# Patient Record
Sex: Female | Born: 1975 | Race: White | Hispanic: No | Smoking: Current every day smoker
Health system: Southern US, Community
[De-identification: ages and names within clinical notes are randomized; demographics above are authoritative.]

## PROBLEM LIST (undated history)

## (undated) DIAGNOSIS — I4891 Unspecified atrial fibrillation: Secondary | ICD-10-CM

## (undated) DIAGNOSIS — E079 Disorder of thyroid, unspecified: Secondary | ICD-10-CM

## (undated) DIAGNOSIS — J449 Chronic obstructive pulmonary disease, unspecified: Secondary | ICD-10-CM

## (undated) DIAGNOSIS — F32A Depression, unspecified: Secondary | ICD-10-CM

## (undated) DIAGNOSIS — I429 Cardiomyopathy, unspecified: Secondary | ICD-10-CM

## (undated) HISTORY — DX: Cardiomyopathy, unspecified: I42.9

## (undated) HISTORY — DX: Depression, unspecified: F32.A

## (undated) HISTORY — PX: DILATION AND CURETTAGE OF UTERUS: SHX78

## (undated) HISTORY — DX: Chronic obstructive pulmonary disease, unspecified: J44.9

## (undated) HISTORY — DX: Unspecified atrial fibrillation: I48.91

## (undated) HISTORY — PX: OTHER SURGICAL HISTORY: SHX169

## (undated) HISTORY — PX: TONSILLECTOMY: SUR1361

---

## 2020-03-05 ENCOUNTER — Other Ambulatory Visit: Payer: Self-pay

## 2020-03-05 ENCOUNTER — Emergency Department (HOSPITAL_COMMUNITY): Payer: Self-pay

## 2020-03-05 ENCOUNTER — Emergency Department (HOSPITAL_COMMUNITY)
Admission: EM | Admit: 2020-03-05 | Discharge: 2020-03-05 | Disposition: A | Discharge status: Home / Self Care | Payer: Self-pay | Attending: Emergency Medicine | Admitting: Emergency Medicine

## 2020-03-05 ENCOUNTER — Encounter (HOSPITAL_COMMUNITY): Payer: Self-pay

## 2020-03-05 DIAGNOSIS — Z5321 Procedure and treatment not carried out due to patient leaving prior to being seen by health care provider: Secondary | ICD-10-CM | POA: Insufficient documentation

## 2020-03-05 DIAGNOSIS — R0602 Shortness of breath: Secondary | ICD-10-CM | POA: Insufficient documentation

## 2020-03-05 HISTORY — DX: Disorder of thyroid, unspecified: E07.9

## 2020-03-05 MED ORDER — ALBUTEROL SULFATE (2.5 MG/3ML) 0.083% IN NEBU: 5.0000 mg | INHALATION_SOLUTION | Freq: Once | RESPIRATORY_TRACT | Status: DC

## 2020-03-05 NOTE — ED Triage Notes (Signed)
SOB that began a couple days ago, increased SOB that started last night. Denies chest pain, lightheadedness, or dizziness. States drinking black coffee helps. No med intervention.

## 2020-03-05 NOTE — ED Notes (Signed)
EKG done and handed to Dr. Zackowski 

## 2020-03-06 ENCOUNTER — Ambulatory Visit
Admission: RE | Admit: 2020-03-06 | Discharge: 2020-03-06 | Disposition: A | Discharge status: Home / Self Care | Payer: Self-pay | Source: Ambulatory Visit | Attending: Internal Medicine | Admitting: Internal Medicine

## 2020-03-06 VITALS — BP 147/92 | HR 68 | Temp 97.7°F | Resp 18

## 2020-03-06 DIAGNOSIS — J441 Chronic obstructive pulmonary disease with (acute) exacerbation: Secondary | ICD-10-CM

## 2020-03-06 MED ORDER — ALBUTEROL SULFATE HFA 108 (90 BASE) MCG/ACT IN AERS: 1.0000 | INHALATION_SPRAY | Freq: Four times a day (QID) | RESPIRATORY_TRACT | 0 refills | Status: DC | PRN

## 2020-03-06 MED ORDER — PREDNISONE 20 MG PO TABS: 40.0000 mg | ORAL_TABLET | Freq: Every day | ORAL | 0 refills | Status: AC

## 2020-03-06 NOTE — ED Triage Notes (Signed)
Pt states she has had cough for past 2 weeks that worsened over past couple days, concerned about asthma

## 2020-03-06 NOTE — Discharge Instructions (Signed)
Smoke cessation is advised If you have worsening symptoms please return to the urgent to be further evaluated.

## 2020-03-07 NOTE — ED Provider Notes (Addendum)
[facility]    CSN: [id number] Arrival date & time: 03/06/20  1507      History   Chief Complaint Cough of 2 weeks duration.  HPI Erin Deleon is a 44 y.o. female with a 30-pack-year history of smoking comes to the urgent care with a 2-week history of cough productive of yellowish sputum, worsening shortness of breath and wheezing.  Patient symptoms started insidiously and has been persistent.  No fever or chills.  Patient continues to smoke cigarettes.  No chest pain or chest pressure.  No sick contacts.  No loss of taste or smell.   HPI  Past Medical History:  Diagnosis Date  . Thyroid disease     There are no problems to display for this patient.   Past Surgical History:  Procedure Laterality Date  . TONSILLECTOMY      OB History   No obstetric history on file.      Home Medications    Prior to Admission medications   Medication Sig Start Date End Date Taking? Authorizing Provider  albuterol (VENTOLIN HFA) 108 (90 Base) MCG/ACT inhaler Inhale 1 puff into the lungs every 6 (six) hours as needed for wheezing or shortness of breath. 03/06/20   Alverto Shedd, Britta Mccreedy, MD  predniSONE (DELTASONE) 20 MG tablet Take 2 tablets (40 mg total) by mouth daily for 5 days. 03/06/20 03/11/20  LampteyBritta Mccreedy, MD    Family History Family History  Family history unknown: Yes    Social History Social History   Tobacco Use  . Smoking status: Current Every Day Smoker    Packs/day: 0.50    Types: Cigarettes  . Smokeless tobacco: Never Used  Substance Use Topics  . Alcohol use: Yes    Comment: occ  . Drug use: Never     Allergies   Patient has no known allergies.   Review of Systems Review of Systems  Constitutional: Negative.   Respiratory: Positive for cough, chest tightness, shortness of breath and wheezing.   Cardiovascular: Negative for chest pain.  Gastrointestinal: Negative.   Musculoskeletal: Negative.   Neurological: Negative for dizziness,  weakness, numbness and headaches.  Psychiatric/Behavioral: Negative for confusion.     Physical Exam Triage Vital Signs ED Triage Vitals  Enc Vitals Group     BP 03/06/20 1552 (!) 147/92     Pulse Rate 03/06/20 1552 68     Resp 03/06/20 1552 18     Temp 03/06/20 1552 97.7 F (36.5 C)     Temp Source 03/06/20 1552 Oral     SpO2 03/06/20 1552 98 %     Weight --      Height --      Head Circumference --      Peak Flow --      Pain Score 03/06/20 1630 0     Pain Loc --      Pain Edu? --      Excl. in GC? --    No data found.  Updated Vital Signs BP (!) 147/92 (BP Location: Right Arm)   Pulse 68   Temp 97.7 F (36.5 C) (Oral)   Resp 18   LMP 02/16/2020   SpO2 98%   Visual Acuity Right Eye Distance:   Left Eye Distance:   Bilateral Distance:    Right Eye Near:   Left Eye Near:    Bilateral Near:     Physical Exam Vitals and nursing note reviewed.  Constitutional:      General: She  is not in acute distress.    Appearance: She is not ill-appearing.  HENT:     Right Ear: Tympanic membrane normal.     Left Ear: Tympanic membrane normal.     Nose: No rhinorrhea.     Mouth/Throat:     Pharynx: No oropharyngeal exudate or posterior oropharyngeal erythema.  Cardiovascular:     Rate and Rhythm: Normal rate and regular rhythm.     Pulses: Normal pulses.     Heart sounds: Normal heart sounds.  Pulmonary:     Effort: Pulmonary effort is normal. No respiratory distress.     Breath sounds: Normal breath sounds. No stridor. No wheezing or rhonchi.  Abdominal:     General: Bowel sounds are normal.     Palpations: Abdomen is soft.  Neurological:     General: No focal deficit present.     Mental Status: She is alert.      UC Treatments / Results  Labs (all labs ordered are listed, but only abnormal results are displayed) Labs Reviewed - No data to display  EKG   Radiology No results found.  Procedures Procedures (including critical care  time)  Medications Ordered in UC Medications - No data to display  Initial Impression / Assessment and Plan / UC Course  I have reviewed the triage vital signs and the nursing notes.  Pertinent labs & imaging results that were available during my care of the patient were reviewed by me and considered in my medical decision making (see chart for details).    1.  Chronic bronchitis with exacerbation: Prednisone 40 mg orally daily for 5 days Albuterol inhaler every 6 hours as needed Smoke cessation counseling was given.  Patient is in the contemplative state of smoke cessation.  Time spent on smoking cessation is 6 minutes Return precautions given Final Clinical Impressions(s) / UC Diagnoses   Final diagnoses:  Obstructive chronic bronchitis with exacerbation [facility])     Discharge Instructions     Smoke cessation is advised If you have worsening symptoms please return to the urgent to be further evaluated.    ED Prescriptions    Medication Sig Dispense Auth. Provider   albuterol (VENTOLIN HFA) 108 (90 Base) MCG/ACT inhaler Inhale 1 puff into the lungs every 6 (six) hours as needed for wheezing or shortness of breath. 18 g Aqueelah Cotrell, Britta Mccreedy, MD   predniSONE (DELTASONE) 20 MG tablet Take 2 tablets (40 mg total) by mouth daily for 5 days. 10 tablet Juaquina Machnik, Britta Mccreedy, MD     PDMP not reviewed this encounter.   Merrilee Jansky, MD 03/07/20 1217    Merrilee Jansky, MD 03/07/20 1218

## 2020-06-19 ENCOUNTER — Ambulatory Visit
Admission: EM | Admit: 2020-06-19 | Discharge: 2020-06-19 | Disposition: A | Discharge status: Home / Self Care | Payer: Self-pay | Attending: Physician Assistant | Admitting: Physician Assistant

## 2020-06-19 ENCOUNTER — Other Ambulatory Visit: Payer: Self-pay

## 2020-06-19 DIAGNOSIS — R300 Dysuria: Secondary | ICD-10-CM

## 2020-06-19 DIAGNOSIS — R609 Edema, unspecified: Secondary | ICD-10-CM

## 2020-06-19 LAB — POCT URINALYSIS DIP (MANUAL ENTRY)
Bilirubin, UA: NEGATIVE
Glucose, UA: NEGATIVE mg/dL
Ketones, POC UA: NEGATIVE mg/dL
Leukocytes, UA: NEGATIVE
Nitrite, UA: NEGATIVE
Protein Ur, POC: 30 mg/dL — AB
Spec Grav, UA: 1.025 (ref 1.010–1.025)
Urobilinogen, UA: 2 E.U./dL — AB
pH, UA: 7 (ref 5.0–8.0)

## 2020-06-19 NOTE — ED Triage Notes (Signed)
Pt presents with c/o difficulty urinating, pt states that she has not been making urine for past week and has taken over the counter diuretics,, had some results ans then states she had pain in vagina las night

## 2020-06-19 NOTE — Discharge Instructions (Addendum)
Go t the Emergency department

## 2020-06-19 NOTE — ED Provider Notes (Addendum)
[facility]    CSN: [id number] Arrival date & time: 06/19/20  1751      History   Chief Complaint Chief Complaint  Patient presents with  . Dysuria    HPI Erin Deleon is a 45 y.o. female.   The history is provided by the patient. No language interpreter was used.  Dysuria Pain quality:  Aching Pain severity:  Moderate Onset quality:  Gradual Timing:  Constant Progression:  Worsening Chronicity:  New Relieved by:  Nothing Worsened by:  Nothing Ineffective treatments:  None tried Associated symptoms: abdominal pain   Associated symptoms: no fever   Pt reports only urinating 3 times in 24 hours.  Pt reports she is short of breath.  Pt reports her legs are swollen.    Past Medical History:  Diagnosis Date  . Thyroid disease     There are no problems to display for this patient.   Past Surgical History:  Procedure Laterality Date  . TONSILLECTOMY      OB History   No obstetric history on file.      Home Medications    Prior to Admission medications   Medication Sig Start Date End Date Taking? Authorizing Provider  albuterol (VENTOLIN HFA) 108 (90 Base) MCG/ACT inhaler Inhale 1 puff into the lungs every 6 (six) hours as needed for wheezing or shortness of breath. 03/06/20   Lamptey, Britta Mccreedy, MD    Family History Family History  Family history unknown: Yes    Social History Social History   Tobacco Use  . Smoking status: Current Every Day Smoker    Packs/day: 0.50    Types: Cigarettes  . Smokeless tobacco: Never Used  Substance Use Topics  . Alcohol use: Yes    Comment: occ  . Drug use: Never     Allergies   Patient has no known allergies.   Review of Systems Review of Systems  Constitutional: Negative for fever.  Gastrointestinal: Positive for abdominal pain.  Genitourinary: Positive for dysuria.  All other systems reviewed and are negative.    Physical Exam Triage Vital Signs ED Triage Vitals  Enc Vitals Group      BP 06/19/20 1808 (!) 158/73     Pulse Rate 06/19/20 1808 (!) 109     Resp 06/19/20 1808 18     Temp 06/19/20 1808 97.9 F (36.6 C)     Temp src --      SpO2 06/19/20 1808 97 %     Weight --      Height --      Head Circumference --      Peak Flow --      Pain Score 06/19/20 1809 5     Pain Loc --      Pain Edu? --      Excl. in GC? --    No data found.  Updated Vital Signs BP (!) 158/73   Pulse (!) 109   Temp 97.9 F (36.6 C)   Resp 18   LMP 04/30/2020   SpO2 97%   Visual Acuity Right Eye Distance:   Left Eye Distance:   Bilateral Distance:    Right Eye Near:   Left Eye Near:    Bilateral Near:     Physical Exam Vitals and nursing note reviewed.  Constitutional:      Appearance: She is well-developed.  HENT:     Head: Normocephalic.  Cardiovascular:     Rate and Rhythm: Normal rate.  Pulmonary:  Effort: Pulmonary effort is normal.  Abdominal:     General: There is no distension.  Musculoskeletal:        General: Swelling and tenderness present. Normal range of motion.     Cervical back: Normal range of motion.     Right lower leg: Edema present.     Left lower leg: Edema present.  Skin:    General: Skin is warm.  Neurological:     General: No focal deficit present.     Mental Status: She is alert and oriented to person, place, and time.  Psychiatric:        Mood and Affect: Mood normal.      UC Treatments / Results  Labs (all labs ordered are listed, but only abnormal results are displayed) Labs Reviewed  POCT URINALYSIS DIP (MANUAL ENTRY) - Abnormal; Notable for the following components:      Result Value   Color, UA straw (*)    Clarity, UA hazy (*)    Blood, UA large (*)    Protein Ur, POC =30 (*)    Urobilinogen, UA 2.0 (*)    All other components within normal limits    EKG   Radiology No results found.  Procedures Procedures (including critical care time)  Medications Ordered in UC Medications - No data to  display  Initial Impression / Assessment and Plan / UC Course  I have reviewed the triage vital signs and the nursing notes.  Pertinent labs & imaging results that were available during my care of the patient were reviewed by me and considered in my medical decision making (see chart for details).     MDM: Pt reports she has very little urine output.  Pt complains of shortness of breath and abdominal pain  Final Clinical Impressions(s) / UC Diagnoses   Final diagnoses:  Fluid retention     Discharge Instructions     Go t the Emergency department    ED Prescriptions    None     PDMP not reviewed this encounter.  An After Visit Summary was printed and given to the patient.    Elson Areas, PA-C 06/19/20 1848    Elson Areas, PA-C 06/19/20 2009

## 2020-07-11 ENCOUNTER — Ambulatory Visit
Admission: EM | Admit: 2020-07-11 | Discharge: 2020-07-11 | Disposition: A | Discharge status: Home / Self Care | Payer: Self-pay | Attending: Internal Medicine | Admitting: Internal Medicine

## 2020-07-11 ENCOUNTER — Encounter: Payer: Self-pay | Admitting: Emergency Medicine

## 2020-07-11 ENCOUNTER — Other Ambulatory Visit: Payer: Self-pay

## 2020-07-11 ENCOUNTER — Ambulatory Visit (INDEPENDENT_AMBULATORY_CARE_PROVIDER_SITE_OTHER): Payer: Self-pay

## 2020-07-11 DIAGNOSIS — M7989 Other specified soft tissue disorders: Secondary | ICD-10-CM

## 2020-07-11 DIAGNOSIS — M898X1 Other specified disorders of bone, shoulder: Secondary | ICD-10-CM

## 2020-07-11 DIAGNOSIS — M25511 Pain in right shoulder: Secondary | ICD-10-CM

## 2020-07-11 MED ORDER — METHYLPREDNISOLONE 4 MG PO TBPK: ORAL_TABLET | ORAL | 0 refills | Status: DC

## 2020-07-11 NOTE — ED Triage Notes (Signed)
Lump at right collar bone area that is tender to the touch x 1 month., no injury that she knows of

## 2020-07-11 NOTE — Discharge Instructions (Addendum)
Your xray shows mild spurring which is changes seen in arthritis. Follow up with a general practitioner or orthopedic Dr if symptoms persist after done with medication

## 2020-07-11 NOTE — ED Provider Notes (Signed)
[facility]    CSN: [id number] Arrival date & time: 07/11/20  1315      History   Chief Complaint No chief complaint on file.   HPI Erin Deleon is a 45 y.o. female who presents with a lump on her R clavicle x 1 month. It is tender to touch. She denies injuring herself on this area or ever. She is R hand dominant  And does a lot of repetitive lifting at work, and has not had time to seek medical care, but today she could not tolerate the pain. Has hx of being diagnosed with RA at age 68 years old. Has been taking Aleeve but has not helped    Past Medical History:  Diagnosis Date  . Thyroid disease     There are no problems to display for this patient.   Past Surgical History:  Procedure Laterality Date  . TONSILLECTOMY      OB History   No obstetric history on file.      Home Medications    Prior to Admission medications   Medication Sig Start Date End Date Taking? Authorizing Provider  methylPREDNISolone (MEDROL DOSEPAK) 4 MG TBPK tablet Take as directed 07/11/20  Yes Rodriguez-Southworth, Nettie Elm, PA-C  albuterol (VENTOLIN HFA) 108 (90 Base) MCG/ACT inhaler Inhale 1 puff into the lungs every 6 (six) hours as needed for wheezing or shortness of breath. 03/06/20   Lamptey, Britta Mccreedy, MD    Family History Family History  Family history unknown: Yes    Social History Social History   Tobacco Use  . Smoking status: Current Every Day Smoker    Packs/day: 0.50    Types: Cigarettes  . Smokeless tobacco: Never Used  Substance Use Topics  . Alcohol use: Yes    Comment: occ  . Drug use: Never     Allergies   Patient has no known allergies.   Review of Systems Review of Systems  Constitutional: Negative for fever.  Musculoskeletal: Positive for arthralgias. Negative for neck pain and neck stiffness.  Neurological: Negative for numbness.  Hematological: Negative for adenopathy.     Physical Exam Triage Vital Signs ED Triage Vitals  Enc  Vitals Group     BP 07/11/20 1324 117/82     Pulse Rate 07/11/20 1324 94     Resp 07/11/20 1324 18     Temp 07/11/20 1324 97.9 F (36.6 C)     Temp Source 07/11/20 1324 Oral     SpO2 07/11/20 1324 95 %     Weight --      Height --      Head Circumference --      Peak Flow --      Pain Score 07/11/20 1325 10     Pain Loc --      Pain Edu? --      Excl. in GC? --    No data found.  Updated Vital Signs BP 117/82 (BP Location: Right Arm)   Pulse 94   Temp 97.9 F (36.6 C) (Oral)   Resp 18   LMP 06/19/2020   SpO2 95%   Visual Acuity Right Eye Distance:   Left Eye Distance:   Bilateral Distance:    Right Eye Near:   Left Eye Near:    Bilateral Near:     Physical Exam Constitutional:      Appearance: Normal appearance. She is obese.  HENT:     Head: Normocephalic.     Right Ear: External ear normal.  Left Ear: External ear normal.  Eyes:     Conjunctiva/sclera: Conjunctivae normal.  Neck:     Comments: No supraclavicular adenopathy noted  Pulmonary:     Effort: Pulmonary effort is normal.  Musculoskeletal:     Cervical back: Neck supple.     Comments: R clavicle with firm swelling and severe tenderness to palpation on the [facility] joint region. There is no fluctuation or redness R SHOULDER- decreased ROM due to pain.   Lymphadenopathy:     Cervical: No cervical adenopathy.     Right cervical: No superficial cervical adenopathy.    Left cervical: No superficial cervical adenopathy.  Skin:    General: Skin is warm and dry.  Neurological:     Mental Status: She is alert and oriented to person, place, and time.     Gait: Gait normal.  Psychiatric:        Mood and Affect: Mood normal.        Behavior: Behavior normal.        Thought Content: Thought content normal.        Judgment: Judgment normal.    UC Treatments / Results  Labs (all labs ordered are listed, but only abnormal results are displayed) Labs Reviewed - No data to  display  EKG   Radiology DG Clavicle Right  Result Date: 07/11/2020 CLINICAL DATA:  Pain and swelling along the distal clavicle EXAM: RIGHT CLAVICLE - 2+ VIEWS COMPARISON:  None FINDINGS: Mild spurring at the [facility] joint. No acute fracture. No overt erosion or bony demineralization in this vicinity. Flaring of the contour of the mid to distal clavicle is present and is often a normal finding although could also be related to a remote fully healed fracture. IMPRESSION: 1. Aside from mild spurring of the [facility] joint, no significant abnormality is observed. If the patient has fluctuance, fever, or signs of infection, or if otherwise clinically warranted, cross-sectional imaging such as MRI could be utilized for further characterization. Electronically Signed   By: Gaylyn Rong M.D.   On: 07/11/2020 14:01    Procedures Procedures (including critical care time)  Medications Ordered in UC Medications - No data to display  Initial Impression / Assessment and Plan / UC Course  I have reviewed the triage vital signs and the nursing notes. Pertinent  imaging results that were available during my care of the patient were reviewed by me and considered in my medical decision making (see chart for details). I placed her on Medrol dose pack. See instructions  Final Clinical Impressions(s) / UC Diagnoses   Final diagnoses:  Pain of right clavicle     Discharge Instructions     Your xray shows mild spurring which is changes seen in arthritis. Follow up with a general practitioner or orthopedic Dr if symptoms persist after done with medication    ED Prescriptions    Medication Sig Dispense Auth. Provider   methylPREDNISolone (MEDROL DOSEPAK) 4 MG TBPK tablet Take as directed 21 tablet Rodriguez-Southworth, Nettie Elm, PA-C     PDMP not reviewed this encounter.   Garey Ham, PA-C 07/11/20 1456

## 2020-08-15 ENCOUNTER — Emergency Department (HOSPITAL_COMMUNITY): Payer: Self-pay

## 2020-08-15 ENCOUNTER — Encounter (HOSPITAL_COMMUNITY): Payer: Self-pay

## 2020-08-15 ENCOUNTER — Other Ambulatory Visit: Payer: Self-pay

## 2020-08-15 ENCOUNTER — Emergency Department (HOSPITAL_COMMUNITY)
Admission: EM | Admit: 2020-08-15 | Discharge: 2020-08-15 | Disposition: A | Discharge status: Home / Self Care | Payer: Self-pay | Attending: Emergency Medicine | Admitting: Emergency Medicine

## 2020-08-15 DIAGNOSIS — R002 Palpitations: Secondary | ICD-10-CM | POA: Insufficient documentation

## 2020-08-15 DIAGNOSIS — R000 Tachycardia, unspecified: Secondary | ICD-10-CM | POA: Insufficient documentation

## 2020-08-15 DIAGNOSIS — F17210 Nicotine dependence, cigarettes, uncomplicated: Secondary | ICD-10-CM | POA: Insufficient documentation

## 2020-08-15 LAB — BASIC METABOLIC PANEL
Anion gap: 7 (ref 5–15)
BUN: 10 mg/dL (ref 6–20)
CO2: 23 mmol/L (ref 22–32)
Calcium: 8.8 mg/dL — ABNORMAL LOW (ref 8.9–10.3)
Chloride: 106 mmol/L (ref 98–111)
Creatinine, Ser: 0.36 mg/dL — ABNORMAL LOW (ref 0.44–1.00)
GFR, Estimated: >60 mL/min (ref >60)
Glucose, Bld: 114 mg/dL — ABNORMAL HIGH (ref 70–99)
Potassium: 3.8 mmol/L (ref 3.5–5.1)
Sodium: 136 mmol/L (ref 135–145)

## 2020-08-15 LAB — CBC
HCT: 37.4 % (ref 36.0–46.0)
Hemoglobin: 11.9 g/dL — ABNORMAL LOW (ref 12.0–15.0)
MCH: 27.1 pg (ref 26.0–34.0)
MCHC: 31.8 g/dL (ref 30.0–36.0)
MCV: 85.2 fL (ref 80.0–100.0)
Platelets: 188 10*3/uL (ref 150–400)
RBC: 4.39 MIL/uL (ref 3.87–5.11)
RDW: 13.3 % (ref 11.5–15.5)
WBC: 5.1 10*3/uL (ref 4.0–10.5)
nRBC: 0 % (ref 0.0–0.2)

## 2020-08-15 LAB — TSH: TSH: <0.01 u[IU]/mL — ABNORMAL LOW (ref 0.350–4.500)

## 2020-08-15 NOTE — ED Notes (Addendum)
Lab informed this RN that pt is refusing to have labs drawn. Stated that pt said she wanted to leave. PA notified.

## 2020-08-15 NOTE — ED Provider Notes (Signed)
[facility] EMERGENCY DEPARTMENT Provider Note   CSN: [id number] Arrival date & time: 08/15/20  1241     History Chief Complaint  Patient presents with  . Tachycardia    Erin Deleon is a 45 y.o. female.  HPI      Erin Deleon is a 44 y.o. female with history of thyroid disease who presents to the Emergency Department complaining of palpitations and feeling of her heart racing.  Symptoms have been present for several days, but worse today which prompted her ER visit.  States that she has episodes in which she feels her heartbeat "really fast then skips."  She was treated previously with thyroid medication, but has been off of her medicines for several years as she has not had any recent issues with her thyroid.  She denies chest pain, shortness of breath, fever, chills, and cough.  No numbness or tingling of her face or extremities.  No syncope.  She does admit to recent increase stressors.    Past Medical History:  Diagnosis Date  . Thyroid disease     There are no problems to display for this patient.   Past Surgical History:  Procedure Laterality Date  . DILATION AND CURETTAGE OF UTERUS    . TONSILLECTOMY       OB History   No obstetric history on file.     Family History  Family history unknown: Yes    Social History   Tobacco Use  . Smoking status: Current Every Day Smoker    Packs/day: 0.50    Types: Cigarettes  . Smokeless tobacco: Never Used  Vaping Use  . Vaping Use: Never used  Substance Use Topics  . Alcohol use: Yes    Comment: occ  . Drug use: Never    Home Medications Prior to Admission medications   Medication Sig Start Date End Date Taking? Authorizing Provider  albuterol (VENTOLIN HFA) 108 (90 Base) MCG/ACT inhaler Inhale 1 puff into the lungs every 6 (six) hours as needed for wheezing or shortness of breath. 03/06/20   Merrilee Jansky, MD  methylPREDNISolone (MEDROL DOSEPAK) 4 MG TBPK tablet Take as directed 07/11/20    Rodriguez-Southworth, Nettie Elm, PA-C    Allergies    Patient has no known allergies.  Review of Systems   Review of Systems  Constitutional: Negative for activity change, chills, fatigue and fever.  HENT: Negative for sore throat and trouble swallowing.   Respiratory: Negative for cough and shortness of breath.   Cardiovascular: Positive for palpitations. Negative for chest pain.  Gastrointestinal: Negative for abdominal pain, diarrhea, nausea and vomiting.  Genitourinary: Negative for dysuria and flank pain.  Musculoskeletal: Negative for arthralgias, back pain, myalgias, neck pain and neck stiffness.  Skin: Negative for rash.  Neurological: Negative for dizziness, weakness and numbness.  Hematological: Does not bruise/bleed easily.    Physical Exam Updated Vital Signs BP (!) 114/56   Pulse 91   Temp 98.4 F (36.9 C) (Oral)   Resp (!) 22   Ht 5\' 8"  (1.727 m)   Wt 131.5 kg   LMP 08/14/2020   SpO2 100%   BMI 44.09 kg/m   Physical Exam Vitals and nursing note reviewed.  Constitutional:      General: She is not in acute distress.    Appearance: Normal appearance.  HENT:     Mouth/Throat:     Mouth: Mucous membranes are moist.  Eyes:     Pupils: Pupils are equal, round, and reactive to light.  Neck:  Thyroid: No thyromegaly.     Meningeal: Kernig's sign absent.  Cardiovascular:     Rate and Rhythm: Normal rate and regular rhythm.     Pulses: Normal pulses.     Heart sounds: Normal heart sounds.  Pulmonary:     Effort: Pulmonary effort is normal.     Breath sounds: Normal breath sounds. No wheezing.  Chest:     Chest wall: No tenderness.  Abdominal:     Palpations: Abdomen is soft.     Tenderness: There is no abdominal tenderness. There is no guarding or rebound.  Musculoskeletal:        General: Normal range of motion.     Cervical back: Normal range of motion and neck supple.     Right lower leg: No edema.     Left lower leg: No edema.  Skin:    General:  Skin is warm.     Capillary Refill: Capillary refill takes less than 2 seconds.     Findings: No rash.  Neurological:     Mental Status: She is alert and oriented to person, place, and time.     Sensory: No sensory deficit.     Motor: No weakness.     ED Results / Procedures / Treatments   Labs (all labs ordered are listed, but only abnormal results are displayed) Labs Reviewed  BASIC METABOLIC PANEL - Abnormal; Notable for the following components:      Result Value   Glucose, Bld 114 (*)    Creatinine, Ser 0.36 (*)    Calcium 8.8 (*)    All other components within normal limits  CBC - Abnormal; Notable for the following components:   Hemoglobin 11.9 (*)    All other components within normal limits  TSH  POC URINE PREG, ED    EKG EKG Interpretation  Date/Time:  Wednesday Aug 15 2020 13:05:11 EDT Ventricular Rate:  99 PR Interval:  182 QRS Duration: 84 QT Interval:  352 QTC Calculation: 451 R Axis:   92 Text Interpretation: Normal sinus rhythm Rightward axis Borderline ECG since last tracing no significant change Confirmed by Mancel Bale [phone]) on 08/15/2020 7:23:41 PM   Radiology DG Chest 2 View  Result Date: 08/15/2020 CLINICAL DATA:  Rapid heart rate. EXAM: CHEST - 2 VIEW COMPARISON:  None. FINDINGS: Mild enlargement of the cardiac silhouette. No consolidation or edema. No visible pleural effusions or pneumothorax. Multilevel degenerative change of the thoracic spine. Mild height loss of a midthoracic vertebral body. IMPRESSION: 1. No evidence of acute cardiopulmonary disease. 2. Mild cardiomegaly. 3. Mild height loss of a midthoracic vertebral body, which may be remote or degenerative but is age indeterminate in the absence of priors. Recommend correlation with the presence or absence of point tenderness or a history of recent trauma. Electronically Signed   By: Feliberto Harts MD   On: 08/15/2020 13:43    Procedures Procedures   Medications Ordered in  ED Medications - No data to display  ED Course  I have reviewed the triage vital signs and the nursing notes.  Pertinent labs & imaging results that were available during my care of the patient were reviewed by me and considered in my medical decision making (see chart for details).    MDM Rules/Calculators/A&P                          Patient here for evaluation of tachycardia and palpitations.  Has history of thyroid issues  and has not been on her medication for some time.  Also endorses recent increased stressors.  Initially vitals were without tachycardia or tachypnea.  Repeat vital signs showed tachypnea.  No significant tachycardia or hypoxia.  Work-up here without source to explain her feelings of tachycardia.  Heart rate has not been above 100 during ER stay.  No hypoxia.   Chest x-ray without acute findings.  D-dimer ordered but patient refused additional blood draw.  PERC neg.  I thoroughly explained the reason for the order to further evaluate for possible PE, including risk involved of undiagnosed PE.  Patient verbalized understanding and continues to decline further phlebotomy.  TSH pending.  Patient states she is ready for discharge home.  Will review TSH on MyChart.  I have discussed importance of close PCP follow-up, patient agreeable to plan.  F/u info given. Strict return precautions were also discussed.   Final Clinical Impression(s) / ED Diagnoses Final diagnoses:  Palpitations    Rx / DC Orders ED Discharge Orders    None       Pauline Aus, PA-C 08/17/20 2210    Mancel Bale, MD 08/18/20 [phone]

## 2020-08-15 NOTE — ED Triage Notes (Signed)
Pt to er, pt states that she has a hx of thyroid problems, states that she is here for a rapid heart rate and palpitation for the past few days, states that she is here today because the palpitation are more constant now.  Pt talking in full sentences, resps even and unlabored.

## 2020-08-15 NOTE — Discharge Instructions (Signed)
You may review your TSH level on the MyChart app.  Your results should be available by tomorrow.  I have listed a local primary care provider for you to establish primary care and I have also listed follow-up information for local cardiology.  Return to the emergency department for any new or worsening symptoms.

## 2020-08-15 NOTE — ED Provider Notes (Signed)
Emergency Medicine Provider Triage Evaluation Note  Erin Deleon , a 44 y.o. female  was evaluated in triage.  Pt complains of increased stress, rapid heart rate and palpitations.  Symptoms have been present for several days, worse today.  Describes feeling episodes that her heart is beating fast and has occasional pause.  Has history of hyperthyroidism was previously treated with medication but states she has been off of her medications for several years and she has not had issues with her thyroid for a while.  She denies recent illness, fever, chills, chest pain or shortness of breath.  Review of Systems  Positive: Rapid heart rate and palpitations Negative: Fever, chills, chest pains, and shortness of breath  Physical Exam  BP (!) 152/85   Pulse 95   Temp 98.4 F (36.9 C) (Oral)   Resp 18   Ht 5\' 8"  (1.727 m)   Wt 131.5 kg   LMP 08/14/2020   SpO2 98%   BMI 44.09 kg/m  Gen:   Awake, no distress   Resp:  Normal effort, lungs clear to auscultation bilaterally MSK:   Moves extremities without difficulty  Other:  Appears anxious, no peripheral edema.  Medical Decision Making  Medically screening exam initiated at 3:32 PM.  Appropriate orders placed.  Danny Zimny was informed that the remainder of the evaluation will be completed by another provider, this initial triage assessment does not replace that evaluation, and the importance of remaining in the ED until their evaluation is complete.  Patient reports feelings of tachycardia and palpitations for several days, worse today.  History of thyroid problems, has not been on thyroid medication for years.  No history of cardiac disease.  Patient will need further evaluation.  Agreeable to plan.   Baruch Merl, PA-C 08/15/20 1537    08/17/20, MD 08/17/20 [phone]

## 2020-08-15 NOTE — ED Notes (Signed)
ED Provider at bedside. 

## 2020-09-12 ENCOUNTER — Other Ambulatory Visit: Payer: Self-pay

## 2020-09-12 ENCOUNTER — Ambulatory Visit
Admission: EM | Admit: 2020-09-12 | Discharge: 2020-09-12 | Disposition: A | Discharge status: Home / Self Care | Payer: Self-pay | Attending: Family Medicine | Admitting: Family Medicine

## 2020-09-12 DIAGNOSIS — R197 Diarrhea, unspecified: Secondary | ICD-10-CM

## 2020-09-12 DIAGNOSIS — E0590 Thyrotoxicosis, unspecified without thyrotoxic crisis or storm: Secondary | ICD-10-CM

## 2020-09-12 NOTE — ED Provider Notes (Signed)
  [facility] CARE CENTER   [id number] 09/12/20 Arrival Time: 1233  ASSESSMENT & PLAN:  1. Diarrhea, unspecified type   2. Hyperthyroidism    Symptoms very well could be from uncontrolled hyperthyroidism. Has appt with new PCP in near future. VSS today. Work note provided. Agrees to f/u here or ED should her symptoms worsen.   Follow-up Information     [facility].   Specialty: Urgent Care Why: If worsening or failing to improve as anticipated. Contact information: [address] [place] [address] [phone]                Reviewed expectations re: course of current medical issues. Questions answered. Outlined signs and symptoms indicating need for more acute intervention. Understanding verbalized. After Visit Summary given.   SUBJECTIVE: History from: patient. Erin Deleon is a 45 y.o. female who reports diarrhea. H/O similar with hyperthyroidism. On/off. Needs a note for missing work. Tolerating PO intake without n/v/d.   OBJECTIVE:  Vitals:   09/12/20 1302  BP: 117/79  Pulse: 92  Resp: 18  Temp: 97.8 F (36.6 C)  SpO2: 95%    General appearance: alert; no distress Eyes: PERRLA; EOMI; conjunctiva normal HENT: [place]; AT Neck: supple  Lungs: speaks full sentences without difficulty; unlabored Extremities: no edema Abd: benign Skin: warm and dry Neurologic: normal gait Psychological: alert and cooperative; normal mood and affect  No Known Allergies  Past Medical History:  Diagnosis Date   Thyroid disease    Social History   Socioeconomic History   Marital status: Significant Other    Spouse name: Not on file   Number of children: Not on file   Years of education: Not on file   Highest education level: Not on file  Occupational History   Not on file  Tobacco Use   Smoking status: Every Day    Packs/day: 0.50    Pack years: 0.00    Types: Cigarettes   Smokeless tobacco: Never   Vaping Use   Vaping Use: Never used  Substance and Sexual Activity   Alcohol use: Yes    Comment: occ   Drug use: Never   Sexual activity: Not on file  Other Topics Concern   Not on file  Social History Narrative   Not on file   Social Determinants of Health   Financial Resource Strain: Not on file  Food Insecurity: Not on file  Transportation Needs: Not on file  Physical Activity: Not on file  Stress: Not on file  Social Connections: Not on file  Intimate Partner Violence: Not on file   Family History  Family history unknown: Yes   Past Surgical History:  Procedure Laterality Date   DILATION AND CURETTAGE OF UTERUS     TONSILLECTOMY       Mardella Layman, MD 09/15/20 [phone]

## 2020-09-12 NOTE — ED Triage Notes (Signed)
Pt presents with c/o diarrhea  for past 3 days that she states is thyroid related , needs work note

## 2020-09-25 ENCOUNTER — Ambulatory Visit: Payer: Self-pay | Admitting: Internal Medicine

## 2020-10-08 ENCOUNTER — Ambulatory Visit: Payer: Self-pay | Admitting: Internal Medicine

## 2020-10-23 ENCOUNTER — Emergency Department (HOSPITAL_COMMUNITY): Payer: Medicaid Other

## 2020-10-23 ENCOUNTER — Encounter (HOSPITAL_COMMUNITY): Payer: Self-pay | Admitting: Emergency Medicine

## 2020-10-23 ENCOUNTER — Other Ambulatory Visit: Payer: Self-pay

## 2020-10-23 ENCOUNTER — Encounter: Payer: Self-pay | Admitting: Nurse Practitioner

## 2020-10-23 ENCOUNTER — Inpatient Hospital Stay (HOSPITAL_COMMUNITY)
Admission: EM | Admit: 2020-10-23 | Discharge: 2020-10-29 | DRG: 643 | Disposition: A | Discharge status: Home / Self Care | Payer: Medicaid Other | Source: Ambulatory Visit | Attending: Internal Medicine | Admitting: Internal Medicine

## 2020-10-23 ENCOUNTER — Ambulatory Visit (INDEPENDENT_AMBULATORY_CARE_PROVIDER_SITE_OTHER): Payer: Medicaid Other | Admitting: Nurse Practitioner

## 2020-10-23 VITALS — BP 115/78 | HR 94 | Temp 97.6°F | Ht 67.0 in | Wt 281.0 lb

## 2020-10-23 DIAGNOSIS — Z9114 Patient's other noncompliance with medication regimen: Secondary | ICD-10-CM

## 2020-10-23 DIAGNOSIS — Z79899 Other long term (current) drug therapy: Secondary | ICD-10-CM | POA: Diagnosis not present

## 2020-10-23 DIAGNOSIS — I5021 Acute systolic (congestive) heart failure: Secondary | ICD-10-CM | POA: Diagnosis present

## 2020-10-23 DIAGNOSIS — I4891 Unspecified atrial fibrillation: Secondary | ICD-10-CM | POA: Insufficient documentation

## 2020-10-23 DIAGNOSIS — R7989 Other specified abnormal findings of blood chemistry: Secondary | ICD-10-CM | POA: Diagnosis present

## 2020-10-23 DIAGNOSIS — E0590 Thyrotoxicosis, unspecified without thyrotoxic crisis or storm: Secondary | ICD-10-CM | POA: Diagnosis present

## 2020-10-23 DIAGNOSIS — Z7689 Persons encountering health services in other specified circumstances: Secondary | ICD-10-CM | POA: Diagnosis not present

## 2020-10-23 DIAGNOSIS — F32A Depression, unspecified: Secondary | ICD-10-CM | POA: Diagnosis present

## 2020-10-23 DIAGNOSIS — R5381 Other malaise: Secondary | ICD-10-CM | POA: Diagnosis present

## 2020-10-23 DIAGNOSIS — J449 Chronic obstructive pulmonary disease, unspecified: Secondary | ICD-10-CM | POA: Diagnosis present

## 2020-10-23 DIAGNOSIS — I509 Heart failure, unspecified: Secondary | ICD-10-CM | POA: Diagnosis not present

## 2020-10-23 DIAGNOSIS — E039 Hypothyroidism, unspecified: Secondary | ICD-10-CM | POA: Diagnosis present

## 2020-10-23 DIAGNOSIS — E0500 Thyrotoxicosis with diffuse goiter without thyrotoxic crisis or storm: Principal | ICD-10-CM | POA: Diagnosis present

## 2020-10-23 DIAGNOSIS — R000 Tachycardia, unspecified: Secondary | ICD-10-CM | POA: Diagnosis not present

## 2020-10-23 DIAGNOSIS — E876 Hypokalemia: Secondary | ICD-10-CM | POA: Diagnosis not present

## 2020-10-23 DIAGNOSIS — Z6841 Body Mass Index (BMI) 40.0 and over, adult: Secondary | ICD-10-CM

## 2020-10-23 DIAGNOSIS — E0591 Thyrotoxicosis, unspecified with thyrotoxic crisis or storm: Secondary | ICD-10-CM

## 2020-10-23 DIAGNOSIS — M7989 Other specified soft tissue disorders: Secondary | ICD-10-CM

## 2020-10-23 DIAGNOSIS — R0989 Other specified symptoms and signs involving the circulatory and respiratory systems: Secondary | ICD-10-CM | POA: Diagnosis present

## 2020-10-23 DIAGNOSIS — Z20822 Contact with and (suspected) exposure to covid-19: Secondary | ICD-10-CM | POA: Diagnosis present

## 2020-10-23 DIAGNOSIS — R0602 Shortness of breath: Secondary | ICD-10-CM | POA: Diagnosis not present

## 2020-10-23 DIAGNOSIS — Z139 Encounter for screening, unspecified: Secondary | ICD-10-CM

## 2020-10-23 DIAGNOSIS — I4819 Other persistent atrial fibrillation: Secondary | ICD-10-CM | POA: Diagnosis not present

## 2020-10-23 DIAGNOSIS — R0600 Dyspnea, unspecified: Secondary | ICD-10-CM | POA: Insufficient documentation

## 2020-10-23 DIAGNOSIS — Z0001 Encounter for general adult medical examination with abnormal findings: Secondary | ICD-10-CM | POA: Insufficient documentation

## 2020-10-23 HISTORY — DX: Thyrotoxicosis, unspecified without thyrotoxic crisis or storm: E05.90

## 2020-10-23 LAB — COMPREHENSIVE METABOLIC PANEL
ALT: 20 U/L (ref 0–44)
AST: 24 U/L (ref 15–41)
Albumin: 3.4 g/dL — ABNORMAL LOW (ref 3.5–5.0)
Alkaline Phosphatase: 136 U/L — ABNORMAL HIGH (ref 38–126)
Anion gap: 10 (ref 5–15)
BUN: 10 mg/dL (ref 6–20)
CO2: 20 mmol/L — ABNORMAL LOW (ref 22–32)
Calcium: 8.9 mg/dL (ref 8.9–10.3)
Chloride: 105 mmol/L (ref 98–111)
Creatinine, Ser: 0.4 mg/dL — ABNORMAL LOW (ref 0.44–1.00)
GFR, Estimated: >60 mL/min (ref >60)
Glucose, Bld: 108 mg/dL — ABNORMAL HIGH (ref 70–99)
Potassium: 3.7 mmol/L (ref 3.5–5.1)
Sodium: 135 mmol/L (ref 135–145)
Total Bilirubin: 2.7 mg/dL — ABNORMAL HIGH (ref 0.3–1.2)
Total Protein: 6.8 g/dL (ref 6.5–8.1)

## 2020-10-23 LAB — PROTIME-INR
INR: 1.3 — ABNORMAL HIGH (ref 0.8–1.2)
Prothrombin Time: 16.3 seconds — ABNORMAL HIGH (ref 11.4–15.2)

## 2020-10-23 LAB — CBC WITH DIFFERENTIAL/PLATELET
Abs Immature Granulocytes: 0.04 10*3/uL (ref 0.00–0.07)
Basophils Absolute: 0 10*3/uL (ref 0.0–0.1)
Basophils Relative: 0 %
Eosinophils Absolute: 0.3 10*3/uL (ref 0.0–0.5)
Eosinophils Relative: 2 %
HCT: 37.6 % (ref 36.0–46.0)
Hemoglobin: 12.1 g/dL (ref 12.0–15.0)
Immature Granulocytes: 0 %
Lymphocytes Relative: 36 %
Lymphs Abs: 4.1 10*3/uL — ABNORMAL HIGH (ref 0.7–4.0)
MCH: 26.3 pg (ref 26.0–34.0)
MCHC: 32.2 g/dL (ref 30.0–36.0)
MCV: 81.7 fL (ref 80.0–100.0)
Monocytes Absolute: 1.2 10*3/uL — ABNORMAL HIGH (ref 0.1–1.0)
Monocytes Relative: 11 %
Neutro Abs: 5.9 10*3/uL (ref 1.7–7.7)
Neutrophils Relative %: 51 %
Platelets: 200 10*3/uL (ref 150–400)
RBC: 4.6 MIL/uL (ref 3.87–5.11)
RDW: 15.9 % — ABNORMAL HIGH (ref 11.5–15.5)
WBC: 11.5 10*3/uL — ABNORMAL HIGH (ref 4.0–10.5)
nRBC: 0 % (ref 0.0–0.2)

## 2020-10-23 LAB — RESP PANEL BY RT-PCR (FLU A&B, COVID) ARPGX2
Influenza A by PCR: NEGATIVE
Influenza B by PCR: NEGATIVE
SARS Coronavirus 2 by RT PCR: NEGATIVE

## 2020-10-23 LAB — APTT: aPTT: 34 seconds (ref 24–36)

## 2020-10-23 LAB — TSH: TSH: <0.01 u[IU]/mL — ABNORMAL LOW (ref 0.350–4.500)

## 2020-10-23 LAB — BRAIN NATRIURETIC PEPTIDE: B Natriuretic Peptide: 632 pg/mL — ABNORMAL HIGH (ref 0.0–100.0)

## 2020-10-23 LAB — CBG MONITORING, ED: Glucose-Capillary: 94 mg/dL (ref 70–99)

## 2020-10-23 LAB — T4, FREE: Free T4: >5.5 ng/dL — ABNORMAL HIGH (ref 0.61–1.12)

## 2020-10-23 MED ORDER — DIAZEPAM 5 MG/ML IJ SOLN
5.0000 mg | Freq: Once | INTRAMUSCULAR | Status: AC
  Administered 2020-10-23: 5 mg via INTRAVENOUS
  Filled 2020-10-23: qty 2

## 2020-10-23 MED ORDER — METOPROLOL TARTRATE 5 MG/5ML IV SOLN
5.0000 mg | Freq: Once | INTRAVENOUS | Status: AC
  Administered 2020-10-23: 5 mg via INTRAVENOUS
  Filled 2020-10-23: qty 5

## 2020-10-23 MED ORDER — ACETAMINOPHEN 325 MG PO TABS: 650.0000 mg | ORAL_TABLET | Freq: Four times a day (QID) | ORAL | Status: DC | PRN

## 2020-10-23 MED ORDER — ONDANSETRON HCL 4 MG/2ML IJ SOLN
4.0000 mg | Freq: Four times a day (QID) | INTRAMUSCULAR | Status: DC | PRN
  Administered 2020-10-23: 4 mg via INTRAVENOUS
  Filled 2020-10-23: qty 2

## 2020-10-23 MED ORDER — FUROSEMIDE 10 MG/ML IJ SOLN
20.0000 mg | Freq: Once | INTRAMUSCULAR | Status: AC
  Administered 2020-10-23: 20 mg via INTRAVENOUS
  Filled 2020-10-23: qty 2

## 2020-10-23 MED ORDER — ALBUTEROL SULFATE (2.5 MG/3ML) 0.083% IN NEBU
2.5000 mg | INHALATION_SOLUTION | RESPIRATORY_TRACT | Status: DC | PRN
  Administered 2020-10-25: 2.5 mg via RESPIRATORY_TRACT
  Filled 2020-10-23: qty 3

## 2020-10-23 MED ORDER — PROPRANOLOL HCL 1 MG/ML IV SOLN: 2.0000 mg | Freq: Once | INTRAVENOUS | Status: DC

## 2020-10-23 MED ORDER — OXYCODONE HCL 5 MG PO TABS: 5.0000 mg | ORAL_TABLET | ORAL | Status: DC | PRN

## 2020-10-23 MED ORDER — PROPRANOLOL HCL 10 MG PO TABS
10.0000 mg | ORAL_TABLET | Freq: Once | ORAL | Status: AC
  Administered 2020-10-23: 10 mg via ORAL
  Filled 2020-10-23: qty 1

## 2020-10-23 MED ORDER — HEPARIN SODIUM (PORCINE) 5000 UNIT/ML IJ SOLN
5000.0000 [IU] | Freq: Three times a day (TID) | INTRAMUSCULAR | Status: DC
  Administered 2020-10-23 – 2020-10-25 (×5): 5000 [IU] via SUBCUTANEOUS
  Filled 2020-10-23 (×5): qty 1

## 2020-10-23 MED ORDER — METHOCARBAMOL 1000 MG/10ML IJ SOLN
500.0000 mg | Freq: Four times a day (QID) | INTRAVENOUS | Status: DC | PRN
  Filled 2020-10-23: qty 5

## 2020-10-23 MED ORDER — DEXAMETHASONE SODIUM PHOSPHATE 4 MG/ML IJ SOLN
4.0000 mg | Freq: Once | INTRAMUSCULAR | Status: AC
  Administered 2020-10-23: 4 mg via INTRAVENOUS
  Filled 2020-10-23: qty 1

## 2020-10-23 MED ORDER — ONDANSETRON HCL 4 MG PO TABS: 4.0000 mg | ORAL_TABLET | Freq: Four times a day (QID) | ORAL | Status: DC | PRN

## 2020-10-23 MED ORDER — METOPROLOL TARTRATE 5 MG/5ML IV SOLN: 5.0000 mg | Freq: Four times a day (QID) | INTRAVENOUS | Status: DC | PRN

## 2020-10-23 MED ORDER — ATENOLOL 25 MG PO TABS
50.0000 mg | ORAL_TABLET | Freq: Every day | ORAL | Status: DC
  Administered 2020-10-23: 50 mg via ORAL
  Filled 2020-10-23: qty 2

## 2020-10-23 MED ORDER — ACETAMINOPHEN 650 MG RE SUPP: 650.0000 mg | Freq: Four times a day (QID) | RECTAL | Status: DC | PRN

## 2020-10-23 MED ORDER — DEXTROSE-NACL 5-0.9 % IV SOLN: INTRAVENOUS | Status: DC

## 2020-10-23 MED ORDER — METOPROLOL TARTRATE 5 MG/5ML IV SOLN
2.5000 mg | Freq: Once | INTRAVENOUS | Status: AC
  Administered 2020-10-23: 2.5 mg via INTRAVENOUS
  Filled 2020-10-23: qty 5

## 2020-10-23 MED ORDER — PROPRANOLOL HCL 10 MG PO TABS
60.0000 mg | ORAL_TABLET | Freq: Once | ORAL | Status: AC
  Administered 2020-10-23: 60 mg via ORAL
  Filled 2020-10-23: qty 6

## 2020-10-23 MED ORDER — METHIMAZOLE 5 MG PO TABS
10.0000 mg | ORAL_TABLET | Freq: Every day | ORAL | Status: DC
  Filled 2020-10-23 (×3): qty 1

## 2020-10-23 MED ORDER — PROPYLTHIOURACIL 50 MG PO TABS
1000.0000 mg | ORAL_TABLET | Freq: Once | ORAL | Status: AC
  Administered 2020-10-23: 1000 mg via ORAL

## 2020-10-23 NOTE — Assessment & Plan Note (Signed)
-  1+ pitting edema to bilateral lower legs to level of knee -likely from CHF related to untreated hyperthyroidism

## 2020-10-23 NOTE — Assessment & Plan Note (Signed)
-  obtain records 

## 2020-10-23 NOTE — Assessment & Plan Note (Signed)
-  has hx of COPD -ran out of albuterol several weeks ago -lungs sound clear today

## 2020-10-23 NOTE — Assessment & Plan Note (Signed)
Lab Results  Component Value Date   TSH <0.010 (L) 08/15/2020  -has had uncontrolled hyperthyroidism for at least 2 months

## 2020-10-23 NOTE — Assessment & Plan Note (Signed)
-  likely related to hyperthyroidism -she has SOB with mild exertion; may have CHF from hyperthyroidism

## 2020-10-23 NOTE — ED Triage Notes (Signed)
Pt states she was seen at PCP office and advised to come to ED for eval. Pt c/o SOB and Afib.

## 2020-10-23 NOTE — Patient Instructions (Signed)
Please have fasting labs drawn 2-3 days prior to your appointment so we can discuss the results during your office visit.  

## 2020-10-23 NOTE — ED Provider Notes (Signed)
[facility] EMERGENCY DEPARTMENT Provider Note   CSN: [id number] Arrival date & time: 10/23/20  1533     History Chief Complaint  Patient presents with   Shortness of Breath    Erin Deleon is a 45 y.o. female.  Patient with known history of Graves' disease.  Patient seen in the emergency department May 18 noted to have a very low TSH consistent with hyperthyroidism.  Patient known to have hypothyroidism.  Been off medications for a long period of time.  Patient without a history of atrial fibrillation.  Patient stated that starting about 2 weeks ago she was not feeling well.  She would have shortness of breath.  But no chest pain.  She is also developed some edema on her abdominal wall without any redness or pain.  Starting yesterday she felt even worse.  Felt as if she had a fever.  Also had a cough that somewhat productive.  Patient was referred to [facility] primary care.  They were able to see her today.  Noticed that her heart rate was in the 180s.  Patient was ill-appearing.  They referred her to the emergency department.  This was her first visit with them.      Past Medical History:  Diagnosis Date   COPD (chronic obstructive pulmonary disease) ([place])    Depression    Thyroid disease     Patient Active Problem List   Diagnosis Date Noted   Encounter to establish care 10/23/2020   Hyperthyroidism 10/23/2020   Tachycardia 10/23/2020   Dyspnea 10/23/2020   COPD (chronic obstructive pulmonary disease) ([place]) 10/23/2020   Leg swelling 10/23/2020   Atrial fibrillation with RVR ([place]) 10/23/2020    Past Surgical History:  Procedure Laterality Date   DILATION AND CURETTAGE OF UTERUS     left leg skin graft Left    right ovary surgery Right    had a mass that was removed surgically; ovary still in place   TONSILLECTOMY       OB History   No obstetric history on file.     Family History  Family history unknown: Yes    Social History   Tobacco Use   Smoking  status: Former    Packs/day: 0.50    Types: Cigarettes   Smokeless tobacco: Never  Vaping Use   Vaping Use: Never used  Substance Use Topics   Alcohol use: Not Currently    Comment: occ; none since thyroid has been off   Drug use: Never    Home Medications Prior to Admission medications   Not on File    Allergies    Patient has no known allergies.  Review of Systems   Review of Systems  Constitutional:  Positive for diaphoresis and fever. Negative for chills and fatigue.  HENT:  Positive for congestion. Negative for ear pain and sore throat.   Eyes:  Negative for pain and visual disturbance.  Respiratory:  Positive for cough and shortness of breath.   Cardiovascular:  Positive for leg swelling. Negative for chest pain and palpitations.  Gastrointestinal:  Negative for abdominal pain and vomiting.  Genitourinary:  Negative for dysuria and hematuria.  Musculoskeletal:  Negative for arthralgias and back pain.  Skin:  Negative for color change and rash.  Neurological:  Negative for seizures and syncope.  Psychiatric/Behavioral:  Negative for confusion.   All other systems reviewed and are negative.  Physical Exam Updated Vital Signs BP 122/74   Pulse 66   Temp 98.1 F (36.7 C) (  Oral)   Resp 20   Ht 1.702 m (_0 )   Wt 127.5 kg   LMP 10/10/2020 (Exact Date)   SpO2 97%   BMI 44.01 kg/m   Physical Exam Vitals and nursing note reviewed.  Constitutional:      General: She is in acute distress.     Appearance: Normal appearance. She is well-developed. She is toxic-appearing.  HENT:     Head: Normocephalic and atraumatic.  Eyes:     Extraocular Movements: Extraocular movements intact.     Conjunctiva/sclera: Conjunctivae normal.     Pupils: Pupils are equal, round, and reactive to light.  Cardiovascular:     Rate and Rhythm: Tachycardia present. Rhythm irregular.     Heart sounds: No murmur heard. Pulmonary:     Effort: Pulmonary effort is normal. No respiratory  distress.     Breath sounds: Normal breath sounds. No wheezing or rales.  Abdominal:     Palpations: Abdomen is soft.     Tenderness: There is no abdominal tenderness.     Comments: Edema without any erythema to the abdominal wall.  More so in the lower quadrants.  Musculoskeletal:        General: Swelling present.     Cervical back: Neck supple. No rigidity.  Skin:    General: Skin is warm and dry.     Capillary Refill: Capillary refill takes less than 2 seconds.  Neurological:     General: No focal deficit present.     Mental Status: She is alert and oriented to person, place, and time.     Cranial Nerves: No cranial nerve deficit.     Sensory: No sensory deficit.     Motor: No weakness.    ED Results / Procedures / Treatments   Labs (all labs ordered are listed, but only abnormal results are displayed) Labs Reviewed  CBC WITH DIFFERENTIAL/PLATELET - Abnormal; Notable for the following components:      Result Value   WBC 11.5 (*)    RDW 15.9 (*)    Lymphs Abs 4.1 (*)    Monocytes Absolute 1.2 (*)    All other components within normal limits  COMPREHENSIVE METABOLIC PANEL - Abnormal; Notable for the following components:   CO2 20 (*)    Glucose, Bld 108 (*)    Creatinine, Ser 0.40 (*)    Albumin 3.4 (*)    Alkaline Phosphatase 136 (*)    Total Bilirubin 2.7 (*)    All other components within normal limits  BRAIN NATRIURETIC PEPTIDE - Abnormal; Notable for the following components:   B Natriuretic Peptide 632.0 (*)    All other components within normal limits  PROTIME-INR - Abnormal; Notable for the following components:   Prothrombin Time 16.3 (*)    INR 1.3 (*)    All other components within normal limits  RESP PANEL BY RT-PCR (FLU A&B, COVID) ARPGX2  CULTURE, BLOOD (ROUTINE X 2)  CULTURE, BLOOD (ROUTINE X 2)  APTT  TSH  T4, FREE  T3, FREE  URINALYSIS, ROUTINE W REFLEX MICROSCOPIC  CBG MONITORING, ED  I-STAT BETA HCG BLOOD, ED ([facility], [facility], [facility] ONLY)     EKG None  Radiology DG Chest Port 1 View  Result Date: 10/23/2020 CLINICAL DATA:  45 year old female with shortness of breath and palpitation. EXAM: PORTABLE CHEST 1 VIEW COMPARISON:  Chest radiograph dated 08/15/2020. FINDINGS: Evaluation is limited due to overlying support wires and pads. There is cardiomegaly with mild vascular congestion. Probable trace bilateral pleural  effusions. No focal consolidation or pneumothorax. No acute osseous pathology. IMPRESSION: Cardiomegaly with mild vascular congestion. No focal consolidation. Electronically Signed   By: Anner Crete M.D.   On: 10/23/2020 16:38    Procedures Procedures   Medications Ordered in ED Medications  dextrose 5 %-0.9 % sodium chloride infusion ( Intravenous New Bag/Given 10/23/20 1728)  propylthiouracil (PTU) tablet 1,000 mg (has no administration in time range)  diazepam (VALIUM) injection 5 mg (5 mg Intravenous Given 10/23/20 1622)  metoprolol tartrate (LOPRESSOR) injection 5 mg (5 mg Intravenous Given 10/23/20 1633)  propranolol (INDERAL) tablet 10 mg (10 mg Oral Given 10/23/20 1646)  dexamethasone (DECADRON) injection 4 mg (4 mg Intravenous Given 10/23/20 1717)  metoprolol tartrate (LOPRESSOR) injection 2.5 mg (2.5 mg Intravenous Given 10/23/20 1702)  metoprolol tartrate (LOPRESSOR) injection 5 mg (5 mg Intravenous Given 10/23/20 1752)  propranolol (INDERAL) tablet 60 mg (60 mg Oral Given 10/23/20 1811)    ED Course  I have reviewed the triage vital signs and the nursing notes.  Pertinent labs & imaging results that were available during my care of the patient were reviewed by me and considered in my medical decision making (see chart for details).    MDM Rules/Calculators/A&P                          CRITICAL CARE Performed by: Fredia Sorrow Total critical care time: 95 minutes Critical care time was exclusive of separately billable procedures and treating other patients. Critical care was necessary to treat  or prevent imminent or life-threatening deterioration. Critical care was time spent personally by me on the following activities: development of treatment plan with patient and/or surrogate as well as nursing, discussions with consultants, evaluation of patient's response to treatment, examination of patient, obtaining history from patient or surrogate, ordering and performing treatments and interventions, ordering and review of laboratory studies, ordering and review of radiographic studies, pulse oximetry and re-evaluation of patient's condition.  Patient presented with significant heart rate up to 206.  Irregular consistent with an atrial fibrillation type pattern.  Patient's blood pressures were also elevated.  She was not febrile.  She seemed to be mentating fine.  Clinically no the patient has hyperthyroidism.  Not necessarily presenting with a classic triad of hyperthermia altered mental status but does have the tachycardia.  Patient thought maybe she had a fever yesterday.  Clinically made the determination that we will treat for thyroid storm.  Although this could just be thyrotoxicosis.  Wanted to give IV propranolol.  But we did not have that available anymore.  Discussed with the pharmacist he recommended Lopressor and then give oral propranolol.  Also gave patient Decadron gave her some Valium which helped calm her down.  Heart rates improving with the Lopressor.  But not improving significantly heart rate now more around the 140s.  Patient given a total of 70 mg of propanolol.  Once LFTs were back which were reasonably normal.  Patient given PTU.  Patient overall is improving.  TSH and free T3-T4 pending.  Mild leukocytosis with a white blood cell count 11.5.  Complete metabolic panel glucose was 108.  Sodium and potassium normal.  BUN and creatinine normal.  Liver function test AST ALT normal alk phos up some at 136 total bili at 2.7.  COVID testing negative.  BNP elevated 632.  Blood  cultures pending.  Chest x-ray consistent for cardiomegaly with mild vascular congestion no focal consolidation.  Final Clinical Impression(s) /  ED Diagnoses Final diagnoses:  Thyrotoxicosis with thyrotoxic crisis, unspecified thyrotoxicosis type    Rx / DC Orders ED Discharge Orders     None        Fredia Sorrow, MD 10/26/20 [phone]

## 2020-10-23 NOTE — Assessment & Plan Note (Signed)
-  EKG showed A-fib with RVR; rate 166 -she is going to emergency department immediately

## 2020-10-23 NOTE — Progress Notes (Signed)
New Patient Office Visit  Subjective:  Patient ID: Erin Deleon, female    DOB: 1975-08-26  Age: 45 y.o. MRN: [id number]  CC:  Chief Complaint  Patient presents with   New Patient (Initial Visit)    Here to establish care today. Complains of shortness of breath x3 weeks ago. Has vomiting and diarrhea ongoing for 2 months. Does have thyroid issues.    HPI Erin Deleon presents for new patient visit.  She states that she took thyroid medication previously, but has not had that medicine for several months. She has hx of Graves Disease and last TSH from 08/15/20 was significantly low.  She has been SOB for 3 weeks. Oxygenating well.  She was diagnosed with COPD in December. She does not have her albuterol inhaler currently.  Past Medical History:  Diagnosis Date   COPD (chronic obstructive pulmonary disease) ([facility])    Depression    Thyroid disease     Past Surgical History:  Procedure Laterality Date   DILATION AND CURETTAGE OF UTERUS     left leg skin graft Left    right ovary surgery Right    had a mass that was removed surgically; ovary still in place   TONSILLECTOMY      Family History  Family history unknown: Yes    Social History   Socioeconomic History   Marital status: Significant Other    Spouse name: Not on file   Number of children: 1   Years of education: Not on file   Highest education level: Not on file  Occupational History   Occupation: Unemployed  Tobacco Use   Smoking status: Former    Packs/day: 0.50    Types: Cigarettes   Smokeless tobacco: Never  Vaping Use   Vaping Use: Never used  Substance and Sexual Activity   Alcohol use: Not Currently    Comment: occ; none since thyroid has been off   Drug use: Never   Sexual activity: Yes    Birth control/protection: None  Other Topics Concern   Not on file  Social History Narrative   Son, 5   Social Determinants of Health   Financial Resource Strain: Not on file  Food Insecurity: Not on  file  Transportation Needs: Not on file  Physical Activity: Not on file  Stress: Not on file  Social Connections: Not on file  Intimate Partner Violence: Not on file    ROS Review of Systems  Constitutional: Negative.   Respiratory:  Positive for shortness of breath.   Cardiovascular:  Positive for leg swelling.  Endocrine:       Hx of Graves disease; has been without her thyroid medication for several months  Psychiatric/Behavioral: Negative.     Objective:   Today's Vitals: BP 115/78 (BP Location: Left Arm, Patient Position: Sitting, Cuff Size: Large)   Pulse 94   Temp 97.6 F (36.4 C) (Temporal)   Ht _0  (1.702 m)   Wt 281 lb (127.5 kg)   LMP 10/10/2020 (Exact Date)   SpO2 96%   BMI 44.01 kg/m   Physical Exam Constitutional:      General: She is in acute distress.     Appearance: Normal appearance. She is obese.  Cardiovascular:     Rate and Rhythm: Tachycardia present. Rhythm irregular.     Pulses: Normal pulses.     Heart sounds: Normal heart sounds.     Comments: 1+ pitting edema from knees down bilaterally Pulmonary:     Effort: Pulmonary  effort is normal.     Breath sounds: Normal breath sounds.  Musculoskeletal:     Comments: Weakness; was difficult for her to stand to get on exam table for EKG; had to use 2-person assist  Neurological:     Mental Status: She is alert.  Psychiatric:        Mood and Affect: Mood normal.        Behavior: Behavior normal.        Thought Content: Thought content normal.        Judgment: Judgment normal.    Assessment & Plan:   Problem List Items Addressed This Visit       Cardiovascular and Mediastinum   Atrial fibrillation with RVR (HCC)    -EKG showed A-fib with RVR; rate 166 -she is going to emergency department immediately         Respiratory   COPD (chronic obstructive pulmonary disease) (HCC)    -has hx of COPD -ran out of albuterol several weeks ago -lungs sound clear today         Endocrine    Hyperthyroidism    Lab Results  Component Value Date   TSH <0.010 (L) 08/15/2020  -has had uncontrolled hyperthyroidism for at least 2 months       Relevant Orders   TSH+T4F+T3Free     Other   Encounter to establish care - Primary    -obtain records       Relevant Orders   CBC with Differential/Platelet   CMP14+EGFR   TSH+T4F+T3Free   Lipid Panel With LDL/HDL Ratio   Tachycardia    -likely related to hyperthyroidism -she has SOB with mild exertion; may have CHF from hyperthyroidism       Dyspnea    -with mild exertion -lungs sound clear -suspect CHF from untreated hyperthyroidism       Leg swelling    -1+ pitting edema to bilateral lower legs to level of knee -likely from CHF related to untreated hyperthyroidism       Other Visit Diagnoses     Screening due       Relevant Orders   Hepatitis C antibody   HIV Antibody (routine testing w rflx)       Outpatient Encounter Medications as of 10/23/2020  Medication Sig   [DISCONTINUED] albuterol (VENTOLIN HFA) 108 (90 Base) MCG/ACT inhaler Inhale 1 puff into the lungs every 6 (six) hours as needed for wheezing or shortness of breath.   [DISCONTINUED] methylPREDNISolone (MEDROL DOSEPAK) 4 MG TBPK tablet Take as directed   No facility-administered encounter medications on file as of 10/23/2020.    Follow-up: Return in about 2 weeks (around 11/06/2020) for Physical Exam.   Noreene Larsson, NP

## 2020-10-23 NOTE — Assessment & Plan Note (Signed)
-  with mild exertion -lungs sound clear -suspect CHF from untreated hyperthyroidism

## 2020-10-24 ENCOUNTER — Inpatient Hospital Stay (HOSPITAL_COMMUNITY): Payer: Medicaid Other

## 2020-10-24 DIAGNOSIS — E0591 Thyrotoxicosis, unspecified with thyrotoxic crisis or storm: Secondary | ICD-10-CM

## 2020-10-24 DIAGNOSIS — I4891 Unspecified atrial fibrillation: Secondary | ICD-10-CM

## 2020-10-24 DIAGNOSIS — I509 Heart failure, unspecified: Secondary | ICD-10-CM

## 2020-10-24 DIAGNOSIS — J449 Chronic obstructive pulmonary disease, unspecified: Secondary | ICD-10-CM

## 2020-10-24 LAB — CBC
HCT: 38.1 % (ref 36.0–46.0)
Hemoglobin: 11.9 g/dL — ABNORMAL LOW (ref 12.0–15.0)
MCH: 25.9 pg — ABNORMAL LOW (ref 26.0–34.0)
MCHC: 31.2 g/dL (ref 30.0–36.0)
MCV: 82.8 fL (ref 80.0–100.0)
Platelets: 180 10*3/uL (ref 150–400)
RBC: 4.6 MIL/uL (ref 3.87–5.11)
RDW: 15.9 % — ABNORMAL HIGH (ref 11.5–15.5)
WBC: 7.7 10*3/uL (ref 4.0–10.5)
nRBC: 0 % (ref 0.0–0.2)

## 2020-10-24 LAB — COMPREHENSIVE METABOLIC PANEL
ALT: 45 U/L — ABNORMAL HIGH (ref 0–44)
AST: 93 U/L — ABNORMAL HIGH (ref 15–41)
Albumin: 3.2 g/dL — ABNORMAL LOW (ref 3.5–5.0)
Alkaline Phosphatase: 129 U/L — ABNORMAL HIGH (ref 38–126)
Anion gap: 11 (ref 5–15)
BUN: 15 mg/dL (ref 6–20)
CO2: 22 mmol/L (ref 22–32)
Calcium: 9.4 mg/dL (ref 8.9–10.3)
Chloride: 104 mmol/L (ref 98–111)
Creatinine, Ser: 0.39 mg/dL — ABNORMAL LOW (ref 0.44–1.00)
GFR, Estimated: >60 mL/min (ref >60)
Glucose, Bld: 125 mg/dL — ABNORMAL HIGH (ref 70–99)
Potassium: 4.5 mmol/L (ref 3.5–5.1)
Sodium: 137 mmol/L (ref 135–145)
Total Bilirubin: 3.3 mg/dL — ABNORMAL HIGH (ref 0.3–1.2)
Total Protein: 6.4 g/dL — ABNORMAL LOW (ref 6.5–8.1)

## 2020-10-24 LAB — URINALYSIS, ROUTINE W REFLEX MICROSCOPIC
Bilirubin Urine: NEGATIVE
Glucose, UA: NEGATIVE mg/dL
Hgb urine dipstick: NEGATIVE
Ketones, ur: NEGATIVE mg/dL
Leukocytes,Ua: NEGATIVE
Nitrite: NEGATIVE
Specific Gravity, Urine: >1.03 — ABNORMAL HIGH (ref 1.005–1.030)
pH: 5.5 (ref 5.0–8.0)

## 2020-10-24 LAB — URINALYSIS, MICROSCOPIC (REFLEX)
Bacteria, UA: NONE SEEN
WBC, UA: NONE SEEN WBC/hpf (ref 0–5)

## 2020-10-24 LAB — MRSA NEXT GEN BY PCR, NASAL: MRSA by PCR Next Gen: NOT DETECTED

## 2020-10-24 LAB — GLUCOSE, CAPILLARY: Glucose-Capillary: 119 mg/dL — ABNORMAL HIGH (ref 70–99)

## 2020-10-24 LAB — T3, FREE: T3, Free: 27.3 pg/mL — ABNORMAL HIGH (ref 2.0–4.4)

## 2020-10-24 MED ORDER — CHLORHEXIDINE GLUCONATE CLOTH 2 % EX PADS
6.0000 | MEDICATED_PAD | Freq: Every day | CUTANEOUS | Status: DC
  Administered 2020-10-24 – 2020-10-28 (×5): 6 via TOPICAL

## 2020-10-24 MED ORDER — PROPRANOLOL HCL 20 MG PO TABS
40.0000 mg | ORAL_TABLET | Freq: Four times a day (QID) | ORAL | Status: DC
  Administered 2020-10-24 – 2020-10-26 (×12): 40 mg via ORAL
  Filled 2020-10-24 (×12): qty 2

## 2020-10-24 MED ORDER — METHIMAZOLE 5 MG PO TABS
20.0000 mg | ORAL_TABLET | Freq: Every day | ORAL | Status: DC
  Administered 2020-10-24 – 2020-10-29 (×6): 20 mg via ORAL
  Filled 2020-10-24 (×7): qty 4

## 2020-10-24 MED ORDER — DILTIAZEM HCL-DEXTROSE 125-5 MG/125ML-% IV SOLN (PREMIX)
5.0000 mg/h | INTRAVENOUS | Status: DC
Start: 2020-10-24 — End: 2020-10-24
  Administered 2020-10-24: 5 mg/h via INTRAVENOUS
  Filled 2020-10-24: qty 125

## 2020-10-24 MED ORDER — DILTIAZEM HCL-DEXTROSE 125-5 MG/125ML-% IV SOLN (PREMIX)
5.0000 mg/h | INTRAVENOUS | Status: DC
  Administered 2020-10-24: 5 mg/h via INTRAVENOUS
  Administered 2020-10-25: 7.5 mg/h via INTRAVENOUS
  Filled 2020-10-24 (×2): qty 125

## 2020-10-24 NOTE — Progress Notes (Addendum)
Same day note  Patient seen and examined at bedside.  Patient was admitted to the hospital for tachycardia.  At the time of my evaluation, patient complains of fatigue and weakness  Physical examination reveals complains of fatigue.  Has tachycardia with RVR.  Bilateral lower extremity edema.  Scar from previous hemangioma removal on the extremity.  Laboratory data and imaging was reviewed  Assessment and Plan.  Thyrotoxicosis Noncompliant to medications.  Beta-blockers and  PTU in the ED.  Patient also received a Decadron and Valium.  Continue metamizole, beta-blockers and has been started on Cardizem drip.  Will need to follow-up with endocrine as outpatient.  Patient does have history of Graves' disease.  Patient has not followed up with endocrine recently but has had follow-up in the past.TSH less than 0.010, free T4 greater than 5.50  A. fib with RVR Secondary to thyrotoxicosis.  On beta-blockers and Cardizem drip.  We will continue for now.    Acute CHF Secondary to thyrotoxicosis.  Check 2D echocardiogram.  Continue beta-blockers.  Off IV fluids.  Received 1 dose of IV Lasix   COPD Avoid nebulizers for now.  Elevated bili Secondary to thyrotoxicosis   Signed,  Leeyah Heather Sherwood Gambler, MD [facility]

## 2020-10-24 NOTE — H&P (Signed)
TRH H&P    Patient Demographics:    Erin Deleon, is a 45 y.o. female  MRN: [id number]  DOB - 09/01/1975  Admit Date - 10/23/2020  Referring MD/NP/PA: Deretha Emory  Outpatient Primary MD for the patient is Heather Roberts, NP  Patient coming from: home  Chief complaint-tachycardia   HPI:    Erin Deleon  is a 45 y.o. female, with history of COPD, depression, hyperthyroidism, presents the ED with a chief complaint of she was sent from her PCP office for tachycardia.  Patient reports that she was establishing with a new PCP, and they did an EKG that was abnormal and sent her to the ER.  She reports that she has not had any chest pain or palpitations.  She has had dyspnea for 2 weeks.  It becomes so bad that even minimal exertion worsens her dyspnea, for example taking a shower.  She has to stop in the middle of her shower and sat on the toilet to catch her breath.  She reports that she has been coughing and has a posttussive emesis.  She had not had dizziness up until today, but did experience dizziness today.  It resolved on its own and she is not sure what changed it.  She reports that she was initially diagnosed with hypothyroidism 11 years ago.  She is to see endocrine who had her on and off medication for it.  She then had her PCP manage her hypothyroidism and was on methimazole per her report.  She reports that she was supposed to have her thyroid out, but when she showed up for the scheduled surgery her heart rate was too high and they would not perform the surgery.  Patient reports that she has been coughing for 2-3 weeks.  It is productive with clear mucus.  She did recently stop smoking and attributes her cough to that.  She has felt fatigued recently and had a subjective fever at home.  She is not taking any beta-blocker at home she does not think she was taking any beta-blocker when she was being treated for her  hyperthyroid either.  No history of thyroid storm, no history of being hospitalized for hyperthyroidism.  Patient does report that she has had edema in her lower extremities.  She has evidence of old trauma and skin graft in her left lower extremity.  In the ED Afebrile, heart rate up to 176, blood pressure stable at admission Slight leukocytosis at 11.5 hemoglobin 12.1 Chemistry panel is mostly unremarkable Respiratory panel is negative BNP is slightly elevated at 632 Blood culture negative UA pending Decadron, Valium, Lopressor, propanolol, PTU given in the ED EKG showed a heart rate of 166, A. fib with RVR, QTC 368  Patient did not need thyroid storm parameters if she had no confusion, no fever Heart rate still remains 140s at admission.    Review of systems:    In addition to the HPI above,  Admits to subjective fever, no chills No Headache, No changes with Vision or hearing, No problems swallowing food or  Liquids, No Chest pain, admits to cough and dyspnea No Abdominal pain, admits to posttussive emesis, bowel movements are regular, No Blood in stool or Urine, No dysuria, No new skin rashes or bruises, No new joints pains-aches,  No new weakness, tingling, numbness in any extremity, No recent weight gain or loss, No polyuria, polydypsia or polyphagia, No significant Mental Stressors.  All other systems reviewed and are negative.    Past History of the following :    Past Medical History:  Diagnosis Date   COPD (chronic obstructive pulmonary disease) (HCC)    Depression    Thyroid disease       Past Surgical History:  Procedure Laterality Date   DILATION AND CURETTAGE OF UTERUS     left leg skin graft Left    right ovary surgery Right    had a mass that was removed surgically; ovary still in place   TONSILLECTOMY        Social History:      Social History   Tobacco Use   Smoking status: Former    Packs/day: 0.50    Types: Cigarettes   Smokeless  tobacco: Never  Substance Use Topics   Alcohol use: Not Currently    Comment: occ; none since thyroid has been off       Family History :     Family History  Family history unknown: Yes      Home Medications:   Prior to Admission medications   Not on File     Allergies:    No Known Allergies   Physical Exam:   Vitals  Blood pressure 110/86, pulse (!) 43, temperature 98.1 F (36.7 C), temperature source Oral, resp. rate 19, height 5\' 7"  (1.702 m), weight 127.5 kg, last menstrual period 10/10/2020, SpO2 98 %.  1.  General: Patient lying supine in bed,  no acute distress   2. Psychiatric: Alert and oriented x 3, flat affect and behavior normal for situation, pleasant and cooperative with exam   3. Neurologic: Speech and language are normal, face is symmetric, moves all 4 extremities voluntarily, at baseline without acute deficits on limited exam   4. HEENMT:  Head is atraumatic, normocephalic, pupils reactive to light, neck is supple, trachea is midline, mucous membranes are moist   5. Respiratory : Lungs are clear to auscultation bilaterally without wheezing, rhonchi, rales, no cyanosis, no increase in work of breathing or accessory muscle use   6. Cardiovascular : Heart rate tachycardic, rhythm is irregular, no murmurs, rubs or gallops, no peripheral edema, peripheral pulses palpated   7. Gastrointestinal:  Abdomen is soft, nondistended, nontender to palpation bowel sounds active, no masses or organomegaly palpated   8. Skin:  Skin is warm, dry and intact without rashes, acute lesions, or ulcers on limited exam   9.Musculoskeletal:  No acute deformities or trauma, no asymmetry in tone, no peripheral edema, peripheral pulses palpated, no tenderness to palpation in the extremities    Data Review:    CBC Recent Labs  Lab 10/23/20 1702  WBC 11.5*  HGB 12.1  HCT 37.6  PLT 200  MCV 81.7  MCH 26.3  MCHC 32.2  RDW 15.9*  LYMPHSABS 4.1*  MONOABS 1.2*   EOSABS 0.3  BASOSABS 0.0   ------------------------------------------------------------------------------------------------------------------  Results for orders placed or performed during the hospital encounter of 10/23/20 (from the past 48 hour(s))  CBG monitoring, ED     Status: None   Collection Time: 10/23/20  4:43 PM  Result Value  Ref Range   Glucose-Capillary 94 70 - 99 mg/dL    Comment: Glucose reference range applies only to samples taken after fasting for at least 8 hours.  CBC with Differential/Platelet     Status: Abnormal   Collection Time: 10/23/20  5:02 PM  Result Value Ref Range   WBC 11.5 (H) 4.0 - 10.5 K/uL   RBC 4.60 3.87 - 5.11 MIL/uL   Hemoglobin 12.1 12.0 - 15.0 g/dL   HCT 23.3 43.5 - 68.6 %   MCV 81.7 80.0 - 100.0 fL   MCH 26.3 26.0 - 34.0 pg   MCHC 32.2 30.0 - 36.0 g/dL   RDW 16.8 (H) 37.2 - 90.2 %   Platelets 200 150 - 400 K/uL   nRBC 0.0 0.0 - 0.2 %   Neutrophils Relative % 51 %   Neutro Abs 5.9 1.7 - 7.7 K/uL   Lymphocytes Relative 36 %   Lymphs Abs 4.1 (H) 0.7 - 4.0 K/uL   Monocytes Relative 11 %   Monocytes Absolute 1.2 (H) 0.1 - 1.0 K/uL   Eosinophils Relative 2 %   Eosinophils Absolute 0.3 0.0 - 0.5 K/uL   Basophils Relative 0 %   Basophils Absolute 0.0 0.0 - 0.1 K/uL   Immature Granulocytes 0 %   Abs Immature Granulocytes 0.04 0.00 - 0.07 K/uL    Comment: Performed at [facility], [address] [place], [place] [address]  Comprehensive metabolic panel     Status: Abnormal   Collection Time: 10/23/20  5:02 PM  Result Value Ref Range   Sodium 135 135 - 145 mmol/L   Potassium 3.7 3.5 - 5.1 mmol/L   Chloride 105 98 - 111 mmol/L   CO2 20 (L) 22 - 32 mmol/L   Glucose, Bld 108 (H) 70 - 99 mg/dL    Comment: Glucose reference range applies only to samples taken after fasting for at least 8 hours.   BUN 10 6 - 20 mg/dL   Creatinine, Ser 2.08 (L) 0.44 - 1.00 mg/dL   Calcium 8.9 8.9 - 02.2 mg/dL   Total Protein 6.8 6.5 - 8.1 g/dL   Albumin 3.4  (L) 3.5 - 5.0 g/dL   AST 24 15 - 41 U/L   ALT 20 0 - 44 U/L   Alkaline Phosphatase 136 (H) 38 - 126 U/L   Total Bilirubin 2.7 (H) 0.3 - 1.2 mg/dL   GFR, Estimated >33 >61 mL/min    Comment: (NOTE) Calculated using the CKD-EPI Creatinine Equation (2021)    Anion gap 10 5 - 15    Comment: Performed at [facility], [address] [place], [place] [address]  Brain natriuretic peptide     Status: Abnormal   Collection Time: 10/23/20  5:02 PM  Result Value Ref Range   B Natriuretic Peptide 632.0 (H) 0.0 - 100.0 pg/mL    Comment: Performed at [facility], [address], [place], [place] [address]  TSH     Status: Abnormal   Collection Time: 10/23/20  5:02 PM  Result Value Ref Range   TSH <0.010 (L) 0.350 - 4.500 uIU/mL    Comment: Performed at [facility], [address], [place], [place] [address]  T4, free     Status: Abnormal   Collection Time: 10/23/20  5:02 PM  Result Value Ref Range   Free T4 >5.50 (H) 0.61 - 1.12 ng/dL    Comment: Performed at [facility], [address] [place], [place] [address]  Culture, blood (Routine X  2) w Reflex to ID Panel     Status: None (Preliminary result)   Collection Time: 10/23/20  5:02 PM   Specimen: BLOOD LEFT HAND  Result Value Ref Range   Specimen Description BLOOD LEFT HAND    Special Requests      Blood Culture adequate volume BOTTLES DRAWN AEROBIC AND ANAEROBIC   Culture      NO GROWTH < 12 HOURS Performed at [facility], [address] [place], [place] [address]    Report Status PENDING   Culture, blood (Routine X 2) w Reflex to ID Panel     Status: None (Preliminary result)   Collection Time: 10/23/20  5:02 PM   Specimen: BLOOD RIGHT ARM  Result Value Ref Range   Specimen Description BLOOD RIGHT ARM    Special Requests      Blood Culture results may not be optimal due to an excessive volume of blood received in culture bottles BOTTLES DRAWN AEROBIC ONLY   Culture      NO GROWTH < 12 HOURS Performed at [facility], [address] [place], [place] [address]    Report Status PENDING   APTT     Status: None   Collection Time: 10/23/20  5:07 PM  Result Value Ref Range   aPTT 34 24 - 36 seconds    Comment: Performed at [facility], [address], [place], [place] [address]  Protime-INR     Status: Abnormal   Collection Time: 10/23/20  5:07 PM  Result Value Ref Range   Prothrombin Time 16.3 (H) 11.4 - 15.2 seconds   INR 1.3 (H) 0.8 - 1.2    Comment: (NOTE) INR goal varies based on device and disease states. Performed at [facility], [address] [place], [place] [address]   Resp Panel by RT-PCR (Flu A&B, Covid) Nasopharyngeal Swab     Status: None   Collection Time: 10/23/20  5:34 PM   Specimen: Nasopharyngeal Swab; Nasopharyngeal(NP) swabs in vial transport medium  Result Value Ref Range   SARS Coronavirus 2 by RT PCR NEGATIVE NEGATIVE    Comment: (NOTE) SARS-CoV-2 target nucleic acids are NOT DETECTED.  The SARS-CoV-2 RNA is generally detectable in upper respiratory specimens during the acute phase of infection. The lowest concentration of SARS-CoV-2 viral copies this assay can detect is 138 copies/mL. A negative result does not preclude SARS-Cov-2 infection and should not be used as the sole basis for treatment or other patient management decisions. A negative result may occur with  improper specimen collection/handling, submission of specimen other than nasopharyngeal swab, presence of viral mutation(s) within the areas targeted by this assay, and inadequate number of viral copies(<138 copies/mL). A negative result must be combined with clinical observations, patient history, and epidemiological information. The expected result is Negative.  Fact Sheet for Patients:  BloggerCourse.com  Fact Sheet for Healthcare Providers:  SeriousBroker.it  This test is no t yet approved or cleared by the [place] FDA and  has been  authorized for detection and/or diagnosis of SARS-CoV-2 by FDA under an Emergency Use Authorization (EUA). This EUA will remain  in effect (meaning this test can be used) for the duration of the COVID-19 declaration under Section 564(b)(1) of the Act, 21 U.S.C.section 360bbb-3(b)(1), unless the authorization is terminated  or revoked sooner.       Influenza A by PCR NEGATIVE NEGATIVE   Influenza B by PCR NEGATIVE NEGATIVE    Comment: (NOTE) The Xpert Xpress SARS-CoV-2/FLU/RSV plus assay is intended  as an aid in the diagnosis of influenza from Nasopharyngeal swab specimens and should not be used as a sole basis for treatment. Nasal washings and aspirates are unacceptable for Xpert Xpress SARS-CoV-2/FLU/RSV testing.  Fact Sheet for Patients: BloggerCourse.com  Fact Sheet for Healthcare Providers: SeriousBroker.it  This test is not yet approved or cleared by the [place] FDA and has been authorized for detection and/or diagnosis of SARS-CoV-2 by FDA under an Emergency Use Authorization (EUA). This EUA will remain in effect (meaning this test can be used) for the duration of the COVID-19 declaration under Section 564(b)(1) of the Act, 21 U.S.C. section 360bbb-3(b)(1), unless the authorization is terminated or revoked.  Performed at [facility], [address], [place], [place] [address]   Urinalysis, Routine w reflex microscopic Urine, Clean Catch     Status: Abnormal   Collection Time: 10/24/20  4:22 AM  Result Value Ref Range   Color, Urine YELLOW YELLOW   APPearance CLEAR CLEAR   Specific Gravity, Urine >1.030 (H) 1.005 - 1.030   pH 5.5 5.0 - 8.0   Glucose, UA NEGATIVE NEGATIVE mg/dL   Hgb urine dipstick NEGATIVE NEGATIVE   Bilirubin Urine NEGATIVE NEGATIVE   Ketones, ur NEGATIVE NEGATIVE mg/dL   Protein, ur TRACE (A) NEGATIVE mg/dL   Nitrite NEGATIVE NEGATIVE   Leukocytes,Ua NEGATIVE NEGATIVE    Comment: Performed  at [facility], [address], [place], [place] [address]  Urinalysis, Microscopic (reflex)     Status: None   Collection Time: 10/24/20  4:22 AM  Result Value Ref Range   RBC / HPF 0-5 0 - 5 RBC/hpf   WBC, UA NONE SEEN 0 - 5 WBC/hpf   Bacteria, UA NONE SEEN NONE SEEN   Squamous Epithelial / LPF 0-5 0 - 5    Comment: Performed at [facility], [address] [place], [place] [address]    Chemistries  Recent Labs  Lab 10/23/20 1702  NA 135  K 3.7  CL 105  CO2 20*  GLUCOSE 108*  BUN 10  CREATININE 0.40*  CALCIUM 8.9  AST 24  ALT 20  ALKPHOS 136*  BILITOT 2.7*   ------------------------------------------------------------------------------------------------------------------  ------------------------------------------------------------------------------------------------------------------ GFR: Estimated Creatinine Clearance: 123.4 mL/min (A) (by C-G formula based on SCr of 0.4 mg/dL (L)). Liver Function Tests: Recent Labs  Lab 10/23/20 1702  AST 24  ALT 20  ALKPHOS 136*  BILITOT 2.7*  PROT 6.8  ALBUMIN 3.4*   No results for input(s): LIPASE, AMYLASE in the last 168 hours. No results for input(s): AMMONIA in the last 168 hours. Coagulation Profile: Recent Labs  Lab 10/23/20 1707  INR 1.3*   Cardiac Enzymes: No results for input(s): CKTOTAL, CKMB, CKMBINDEX, TROPONINI in the last 168 hours. BNP (last 3 results) No results for input(s): PROBNP in the last 8760 hours. HbA1C: No results for input(s): HGBA1C in the last 72 hours. CBG: Recent Labs  Lab 10/23/20 1643  GLUCAP 94   Lipid Profile: No results for input(s): CHOL, HDL, LDLCALC, TRIG, CHOLHDL, LDLDIRECT in the last 72 hours. Thyroid Function Tests: Recent Labs    10/23/20 1702  TSH <0.010*  FREET4 >5.50*   Anemia Panel: No results for input(s): VITAMINB12, FOLATE, FERRITIN, TIBC, IRON, RETICCTPCT in the last 72  hours.  --------------------------------------------------------------------------------------------------------------- Urine analysis:    Component Value Date/Time   COLORURINE YELLOW 10/24/2020 0422   APPEARANCEUR CLEAR 10/24/2020 0422   LABSPEC >1.030 (H) 10/24/2020 0422   PHURINE 5.5 10/24/2020 0422   GLUCOSEU NEGATIVE 10/24/2020 0422  HGBUR NEGATIVE 10/24/2020 0422   BILIRUBINUR NEGATIVE 10/24/2020 0422   BILIRUBINUR negative 06/19/2020 1817   KETONESUR NEGATIVE 10/24/2020 0422   PROTEINUR TRACE (A) 10/24/2020 0422   UROBILINOGEN 2.0 (A) 06/19/2020 1817   NITRITE NEGATIVE 10/24/2020 0422   LEUKOCYTESUR NEGATIVE 10/24/2020 0422      Imaging Results:    DG Chest Port 1 View  Result Date: 10/23/2020 CLINICAL DATA:  45 year old female with shortness of breath and palpitation. EXAM: PORTABLE CHEST 1 VIEW COMPARISON:  Chest radiograph dated 08/15/2020. FINDINGS: Evaluation is limited due to overlying support wires and pads. There is cardiomegaly with mild vascular congestion. Probable trace bilateral pleural effusions. No focal consolidation or pneumothorax. No acute osseous pathology. IMPRESSION: Cardiomegaly with mild vascular congestion. No focal consolidation. Electronically Signed   By: Elgie CollardArash  Radparvar M.D.   On: 10/23/2020 16:38       Assessment & Plan:    Principal Problem:   Thyrotoxicosis Active Problems:   COPD (chronic obstructive pulmonary disease) (HCC)   Atrial fibrillation with RVR (HCC)   Acute CHF (congestive heart failure) (HCC)   Thyrotoxicosis TSH less than 0.010, free T4 greater than 5.50 Beta-blockers and PTU given in the ED as well as Decadron and Valium Start methimazole, continue beta-blockers as needed and scheduled Patient will ultimately need to be set up with endocrine, possible general surgeon A. fib with RVR Heart rate as high as 188 in the ED EKG shows A. fib with RVR Heart rate remained in the 140s despite multiple  beta-blockers Starting Cardizem drip Acute CHF Secondary to thyrotoxicosis Echo in the a.m. 1 dose Lasix Stop fluids Continue beta-blockers Monitor on telemetry COPD Not currently in exacerbation Continue to monitor Elevated bili Secondary to thyrotoxicosis Trend    DVT Prophylaxis-   Heparin- SCDs   AM Labs Ordered, also please review Full Orders  Family Communication: No family at bedside  Code Status: Full  Admission status: Inpatient :The appropriate admission status for this patient is INPATIENT. Inpatient status is judged to be reasonable and necessary in order to provide the required intensity of service to ensure the patient's safety. The patient's presenting symptoms, physical exam findings, and initial radiographic and laboratory data in the context of their chronic comorbidities is felt to place them at high risk for further clinical deterioration. Furthermore, it is not anticipated that the patient will be medically stable for discharge from the hospital within 2 midnights of admission. The following factors support the admission status of inpatient.     The patient's presenting symptoms include dyspnea. The worrisome physical exam findings include A. fib RVR. The initial radiographic and laboratory data are worrisome because of extremely low TSH, high T4. The chronic co-morbidities include Graves' disease, COPD, depression      * I certify that at the point of admission it is my clinical judgment that the patient will require inpatient hospital care spanning beyond 2 midnights from the point of admission due to high intensity of service, high risk for further deterioration and high frequency of surveillance required.*  Time spent in minutes : 65   Secret Kristensen B Zierle-Ghosh DO

## 2020-10-24 NOTE — Progress Notes (Addendum)
Notified MD that pt remains in afib and heart rate has remained unchanged since cardizem drip was discontinued this AM and ECHO is unable to be performed at this time. New order placed for cardizem drip.

## 2020-10-24 NOTE — Plan of Care (Signed)
  Problem: Education: Goal: Knowledge of General Education information will improve Description Including pain rating scale, medication(s)/side effects and non-pharmacologic comfort measures Outcome: Progressing   Problem: Health Behavior/Discharge Planning: Goal: Ability to manage health-related needs will improve Outcome: Progressing   

## 2020-10-25 ENCOUNTER — Encounter (HOSPITAL_COMMUNITY): Payer: Self-pay | Admitting: Family Medicine

## 2020-10-25 ENCOUNTER — Inpatient Hospital Stay (HOSPITAL_COMMUNITY): Payer: Medicaid Other

## 2020-10-25 DIAGNOSIS — I509 Heart failure, unspecified: Secondary | ICD-10-CM

## 2020-10-25 DIAGNOSIS — I4891 Unspecified atrial fibrillation: Secondary | ICD-10-CM

## 2020-10-25 DIAGNOSIS — I5021 Acute systolic (congestive) heart failure: Secondary | ICD-10-CM

## 2020-10-25 LAB — BASIC METABOLIC PANEL
Anion gap: 9 (ref 5–15)
BUN: 22 mg/dL — ABNORMAL HIGH (ref 6–20)
CO2: 23 mmol/L (ref 22–32)
Calcium: 9.1 mg/dL (ref 8.9–10.3)
Chloride: 104 mmol/L (ref 98–111)
Creatinine, Ser: 0.41 mg/dL — ABNORMAL LOW (ref 0.44–1.00)
GFR, Estimated: >60 mL/min (ref >60)
Glucose, Bld: 112 mg/dL — ABNORMAL HIGH (ref 70–99)
Potassium: 3.6 mmol/L (ref 3.5–5.1)
Sodium: 136 mmol/L (ref 135–145)

## 2020-10-25 LAB — ECHOCARDIOGRAM COMPLETE
AR max vel: 2.56 cm2
AV Area VTI: 2.83 cm2
AV Area mean vel: 2.43 cm2
AV Mean grad: 6 mmHg
AV Peak grad: 9.9 mmHg
Ao pk vel: 1.57 m/s
Area-P 1/2: 3.37 cm2
Height: 67 in
MV VTI: 3.03 cm2
S' Lateral: 3.81 cm
Weight: 4423.31 oz

## 2020-10-25 LAB — CBC
HCT: 36.6 % (ref 36.0–46.0)
Hemoglobin: 11.5 g/dL — ABNORMAL LOW (ref 12.0–15.0)
MCH: 25.8 pg — ABNORMAL LOW (ref 26.0–34.0)
MCHC: 31.4 g/dL (ref 30.0–36.0)
MCV: 82.1 fL (ref 80.0–100.0)
Platelets: 178 10*3/uL (ref 150–400)
RBC: 4.46 MIL/uL (ref 3.87–5.11)
RDW: 15.6 % — ABNORMAL HIGH (ref 11.5–15.5)
WBC: 11.1 10*3/uL — ABNORMAL HIGH (ref 4.0–10.5)
nRBC: 0 % (ref 0.0–0.2)

## 2020-10-25 LAB — MAGNESIUM: Magnesium: 1.9 mg/dL (ref 1.7–2.4)

## 2020-10-25 LAB — GLUCOSE, CAPILLARY: Glucose-Capillary: 100 mg/dL — ABNORMAL HIGH (ref 70–99)

## 2020-10-25 LAB — PHOSPHORUS: Phosphorus: 3.3 mg/dL (ref 2.5–4.6)

## 2020-10-25 LAB — HEPARIN LEVEL (UNFRACTIONATED): Heparin Unfractionated: <0.1 IU/mL — ABNORMAL LOW (ref 0.30–0.70)

## 2020-10-25 MED ORDER — FUROSEMIDE 10 MG/ML IJ SOLN
20.0000 mg | Freq: Once | INTRAMUSCULAR | Status: AC
  Administered 2020-10-25: 20 mg via INTRAVENOUS
  Filled 2020-10-25: qty 2

## 2020-10-25 MED ORDER — DIGOXIN 0.25 MG/ML IJ SOLN
0.2500 mg | Freq: Four times a day (QID) | INTRAMUSCULAR | Status: AC
Start: 2020-10-25 — End: 2020-10-25
  Administered 2020-10-25 (×2): 0.25 mg via INTRAVENOUS
  Filled 2020-10-25 (×2): qty 2

## 2020-10-25 MED ORDER — ALBUTEROL SULFATE (2.5 MG/3ML) 0.083% IN NEBU: 3.0000 mL | INHALATION_SOLUTION | Freq: Four times a day (QID) | RESPIRATORY_TRACT | Status: DC | PRN

## 2020-10-25 MED ORDER — HEPARIN BOLUS VIA INFUSION
5000.0000 [IU] | Freq: Once | INTRAVENOUS | Status: AC
  Administered 2020-10-25: 5000 [IU] via INTRAVENOUS
  Filled 2020-10-25: qty 5000

## 2020-10-25 MED ORDER — DIGOXIN 0.25 MG/ML IJ SOLN
0.5000 mg | Freq: Once | INTRAMUSCULAR | Status: AC
  Administered 2020-10-25: 0.5 mg via INTRAVENOUS
  Filled 2020-10-25: qty 2

## 2020-10-25 MED ORDER — HEPARIN BOLUS VIA INFUSION
3000.0000 [IU] | Freq: Once | INTRAVENOUS | Status: AC
  Administered 2020-10-25: 3000 [IU] via INTRAVENOUS
  Filled 2020-10-25: qty 3000

## 2020-10-25 MED ORDER — FUROSEMIDE 10 MG/ML IJ SOLN
40.0000 mg | Freq: Two times a day (BID) | INTRAMUSCULAR | Status: DC
  Administered 2020-10-25 – 2020-10-27 (×4): 40 mg via INTRAVENOUS
  Filled 2020-10-25 (×4): qty 4

## 2020-10-25 MED ORDER — HEPARIN (PORCINE) 25000 UT/250ML-% IV SOLN
1800.0000 [IU]/h | INTRAVENOUS | Status: DC
  Administered 2020-10-25: 1300 [IU]/h via INTRAVENOUS
  Administered 2020-10-25: 1600 [IU]/h via INTRAVENOUS
  Filled 2020-10-25 (×2): qty 250

## 2020-10-25 NOTE — Progress Notes (Addendum)
PROGRESS NOTE  Erin Deleon ACZ:[id number] DOB: 05-13-1975 DOA: 10/23/2020 PCP: Heather Roberts, NP   LOS: 2 days   Brief narrative:  Erin Deleon  is a 45 y.o. female, with history of COPD, depression, hyperthyroidism, presented to the emergency department with complaints of tachycardia.  Patient was sent to the hospital by her PCP who noted that she had an abnormal EKG.  Patient denies any chest pain or palpitation.  She however had dyspnea for 2 weeks minimal exertion as well.  Patient was initially diagnosed with hyperthyroidism 11 years ago.   No history of thyroid storm, no history of being hospitalized for hyperthyroidism.  In the ED patient had tachycardia of 176 and was in atrial fibrillation with RVR.[name]  In the ED, patient received Decadron, Valium, Lopressor, propanolol, PTU given in the ED.   Assessment/Plan:  Principal Problem:   Thyrotoxicosis Active Problems:   COPD (chronic obstructive pulmonary disease) (HCC)   Atrial fibrillation with RVR (HCC)   Acute CHF (congestive heart failure) (HCC)   Thyrotoxicosis Noncompliant to medications.  Beta-blockers and  PTU in the ED.  Patient also received a Decadron and Valium.  Continue metamizole, beta-blockers and has been started on Cardizem drip.  Will need to follow-up with endocrine as outpatient.  Patient does have history of Graves' disease.  Patient has not followed up with endocrine recently but has had follow-up in the past.TSH less than 0.010, free T4 greater than 5.50.   A. fib with RVR Secondary to thyrotoxicosis.  On beta-blockers and Cardizem drip.  We will continue for now.     Acute CHF Secondary to thyrotoxicosis.  Check 2D echocardiogram.  Unable to perform due to tachycardia.  Spoke with cardiology due to decompensated heart failure and atrial fibrillation with RVR.  Continue beta-blockers.  Will receive 1 dose of IV Lasix again today due to pulmonary congestion and lower extremity edema.[name]   COPD Avoid nebulizers  for now.  Change to inhalers   Elevated bili Secondary to thyrotoxicosis  Morbid obesity.  Patient would benefit from weight loss as outpatient.  DVT prophylaxis: heparin injection 5,000 Units Start: 10/23/20 2200 SCDs Start: 10/23/20 2114   Code Status: Full code  Family Communication: None  Status is: Inpatient  Remains inpatient appropriate because:IV treatments appropriate due to intensity of illness or inability to take PO and Inpatient level of care appropriate due to severity of illness  Dispo: The patient is from: Home              Anticipated d/c is to: Home              Patient currently is not medically stable to d/c.   Difficult to place patient No   Consultants: Cardiology  Procedures: None  Anti-infectives:  None  Anti-infectives (From admission, onward)    None      Subjective: Today, patient was seen and examined at bedside.  Complains of fatigue and shortness of breath.  Objective: Vitals:   10/25/20 0830 10/25/20 0900  BP: 110/68 (!) 106/45  Pulse: (!) 119 90  Resp: (!) 31 (!) 31  Temp:    SpO2: 95% 94%    Intake/Output Summary (Last 24 hours) at 10/25/2020 0917 Last data filed at 10/25/2020 0000 Gross per 24 hour  Intake 276 ml  Output --  Net 276 ml   Filed Weights   10/23/20 1539 10/25/20 0403  Weight: 127.5 kg 125.4 kg   Body mass index is 43.3 kg/m.   Physical Exam:  GENERAL: Patient is alert awake and oriented. Not in obvious distress.  Morbidly obese. HENT: No scleral pallor or icterus. Pupils equally reactive to light. Oral mucosa is moist NECK: is supple, no gross swelling noted. CHEST: Clear to auscultation. No crackles or wheezes.  Diminished breath sounds bilaterally. CVS: S1 and S2 heard, no murmur.  Irregularly irregular rhythm with tachycardia ABDOMEN: Soft, non-tender, bowel sounds are present. EXTREMITIES: Bilateral lower extremity edema. CNS: Cranial nerves are intact. No focal motor deficits. SKIN: warm and  dry without rashes.  Data Review: I have personally reviewed the following laboratory data and studies,  CBC: Recent Labs  Lab 10/23/20 1702 10/24/20 0539 10/25/20 0530  WBC 11.5* 7.7 11.1*  NEUTROABS 5.9  --   --   HGB 12.1 11.9* 11.5*  HCT 37.6 38.1 36.6  MCV 81.7 82.8 82.1  PLT 200 180 178   Basic Metabolic Panel: Recent Labs  Lab 10/23/20 1702 10/24/20 0539 10/25/20 0530  NA 135 137 136  K 3.7 4.5 3.6  CL 105 104 104  CO2 20* 22 23  GLUCOSE 108* 125* 112*  BUN 10 15 22*  CREATININE 0.40* 0.39* 0.41*  CALCIUM 8.9 9.4 9.1  MG  --   --  1.9  PHOS  --   --  3.3   Liver Function Tests: Recent Labs  Lab 10/23/20 1702 10/24/20 0539  AST 24 93*  ALT 20 45*  ALKPHOS 136* 129*  BILITOT 2.7* 3.3*  PROT 6.8 6.4*  ALBUMIN 3.4* 3.2*   No results for input(s): LIPASE, AMYLASE in the last 168 hours. No results for input(s): AMMONIA in the last 168 hours. Cardiac Enzymes: No results for input(s): CKTOTAL, CKMB, CKMBINDEX, TROPONINI in the last 168 hours. BNP (last 3 results) Recent Labs    10/23/20 1702  BNP 632.0*    ProBNP (last 3 results) No results for input(s): PROBNP in the last 8760 hours.  CBG: Recent Labs  Lab 10/23/20 1643 10/24/20 0856 10/25/20 0732  GLUCAP 94 119* 100*   Recent Results (from the past 240 hour(s))  Culture, blood (Routine X 2) w Reflex to ID Panel     Status: None (Preliminary result)   Collection Time: 10/23/20  5:02 PM   Specimen: BLOOD LEFT HAND  Result Value Ref Range Status   Specimen Description BLOOD LEFT HAND  Final   Special Requests   Final    Blood Culture adequate volume BOTTLES DRAWN AEROBIC AND ANAEROBIC   Culture   Final    NO GROWTH 2 DAYS Performed at [facility], [address], [place], [place] [address]    Report Status PENDING  Incomplete  Culture, blood (Routine X 2) w Reflex to ID Panel     Status: None (Preliminary result)   Collection Time: 10/23/20  5:02 PM   Specimen: BLOOD RIGHT ARM  Result  Value Ref Range Status   Specimen Description BLOOD RIGHT ARM  Final   Special Requests   Final    Blood Culture results may not be optimal due to an excessive volume of blood received in culture bottles BOTTLES DRAWN AEROBIC ONLY   Culture   Final    NO GROWTH 2 DAYS Performed at [facility], [address] [place], [place] [address]    Report Status PENDING  Incomplete  Resp Panel by RT-PCR (Flu A&B, Covid) Nasopharyngeal Swab     Status: None   Collection Time: 10/23/20  5:34 PM   Specimen: Nasopharyngeal Swab; Nasopharyngeal(NP) swabs in vial transport  medium  Result Value Ref Range Status   SARS Coronavirus 2 by RT PCR NEGATIVE NEGATIVE Final    Comment: (NOTE) SARS-CoV-2 target nucleic acids are NOT DETECTED.  The SARS-CoV-2 RNA is generally detectable in upper respiratory specimens during the acute phase of infection. The lowest concentration of SARS-CoV-2 viral copies this assay can detect is 138 copies/mL. A negative result does not preclude SARS-Cov-2 infection and should not be used as the sole basis for treatment or other patient management decisions. A negative result may occur with  improper specimen collection/handling, submission of specimen other than nasopharyngeal swab, presence of viral mutation(s) within the areas targeted by this assay, and inadequate number of viral copies(<138 copies/mL). A negative result must be combined with clinical observations, patient history, and epidemiological information. The expected result is Negative.  Fact Sheet for Patients:  BloggerCourse.com  Fact Sheet for Healthcare Providers:  SeriousBroker.it  This test is no t yet approved or cleared by the [place] FDA and  has been authorized for detection and/or diagnosis of SARS-CoV-2 by FDA under an Emergency Use Authorization (EUA). This EUA will remain  in effect (meaning this test can be used) for the duration of  the COVID-19 declaration under Section 564(b)(1) of the Act, 21 U.S.C.section 360bbb-3(b)(1), unless the authorization is terminated  or revoked sooner.       Influenza A by PCR NEGATIVE NEGATIVE Final   Influenza B by PCR NEGATIVE NEGATIVE Final    Comment: (NOTE) The Xpert Xpress SARS-CoV-2/FLU/RSV plus assay is intended as an aid in the diagnosis of influenza from Nasopharyngeal swab specimens and should not be used as a sole basis for treatment. Nasal washings and aspirates are unacceptable for Xpert Xpress SARS-CoV-2/FLU/RSV testing.  Fact Sheet for Patients: BloggerCourse.com  Fact Sheet for Healthcare Providers: SeriousBroker.it  This test is not yet approved or cleared by the [place] FDA and has been authorized for detection and/or diagnosis of SARS-CoV-2 by FDA under an Emergency Use Authorization (EUA). This EUA will remain in effect (meaning this test can be used) for the duration of the COVID-19 declaration under Section 564(b)(1) of the Act, 21 U.S.C. section 360bbb-3(b)(1), unless the authorization is terminated or revoked.  Performed at [facility], [address] [place], [place] [address]   MRSA Next Gen by PCR, Nasal     Status: None   Collection Time: 10/24/20  9:38 AM   Specimen: Nasal Mucosa; Nasal Swab  Result Value Ref Range Status   MRSA by PCR Next Gen NOT DETECTED NOT DETECTED Final    Comment: (NOTE) The GeneXpert MRSA Assay (FDA approved for NASAL specimens only), is one component of a comprehensive MRSA colonization surveillance program. It is not intended to diagnose MRSA infection nor to guide or monitor treatment for MRSA infections. Test performance is not FDA approved in patients less than 38 years old. Performed at [facility], [address], [place], [place] [address]      Studies: Portable chest 1 View  Result Date: 10/24/2020 CLINICAL DATA:  Pulmonary vascular congestion  EXAM: PORTABLE CHEST 1 VIEW COMPARISON:  10/23/2020 FINDINGS: Mild bilateral interstitial thickening. No focal consolidation. No pleural effusion or pneumothorax. Stable cardiomegaly. No acute osseous abnormality. IMPRESSION: Cardiomegaly with mild pulmonary vascular congestion. Electronically Signed   By: Elige Ko   On: 10/24/2020 06:26   DG Chest Port 1 View  Result Date: 10/23/2020 CLINICAL DATA:  45 year old female with shortness of breath and palpitation. EXAM: PORTABLE CHEST 1 VIEW COMPARISON:  Chest  radiograph dated 08/15/2020. FINDINGS: Evaluation is limited due to overlying support wires and pads. There is cardiomegaly with mild vascular congestion. Probable trace bilateral pleural effusions. No focal consolidation or pneumothorax. No acute osseous pathology. IMPRESSION: Cardiomegaly with mild vascular congestion. No focal consolidation. Electronically Signed   By: Elgie Collard M.D.   On: 10/23/2020 16:38      Joycelyn Das, MD  [facility] 10/25/2020  If 7PM-7AM, please contact night-coverage

## 2020-10-25 NOTE — Progress Notes (Signed)
ANTICOAGULATION CONSULT NOTE - Initial Consult  Pharmacy Consult for heparin  Indication: atrial fibrillation  No Known Allergies  Patient Measurements: Height: 5\' 7"  (170.2 cm) Weight: 125.4 kg (276 lb 7.3 oz) IBW/kg (Calculated) : 61.6 Heparin Dosing Weight: 92 kg  Vital Signs: Temp: 97.4 F (36.3 C) (07/28 0403) Temp Source: Oral (07/28 0403) BP: 158/120 (07/28 1000) Pulse Rate: 88 (07/28 1000)  Labs: Recent Labs    10/23/20 1702 10/23/20 1707 10/24/20 0539 10/25/20 0530  HGB 12.1  --  11.9* 11.5*  HCT 37.6  --  38.1 36.6  PLT 200  --  180 178  APTT  --  34  --   --   LABPROT  --  16.3*  --   --   INR  --  1.3*  --   --   CREATININE 0.40*  --  0.39* 0.41*    Estimated Creatinine Clearance: 122.1 mL/min (A) (by C-G formula based on SCr of 0.41 mg/dL (L)).   Medical History: Past Medical History:  Diagnosis Date   COPD (chronic obstructive pulmonary disease) (HCC)    Depression    Thyroid disease     Medications:  No medications prior to admission.    Assessment: Pharmacy consulted to dose heparin in patient with atrial fibrillation.  Patient is not on anticoagulation prior to admission.  CBC WNL  Goal of Therapy:  Heparin level 0.3-0.7 units/ml Monitor platelets by anticoagulation protocol: Yes   Plan:  Give 5000 units bolus x 1 Start heparin infusion at 1300 units/hr Check anti-Xa level in 6 hours and daily while on heparin Continue to monitor H&H and platelets  10/27/20, PharmD Clinical Pharmacist 10/25/2020 10:49 AM

## 2020-10-25 NOTE — Progress Notes (Addendum)
Cardiology Consultation:   Patient ID: Erin Deleon MRN: [id number]; DOB: 19-Jul-1975  Admit date: 10/23/2020 Date of Consult: 10/25/2020  PCP:  Noreene Larsson, NP   [facility] Providers Cardiologist:  None       new Patient Profile:   Erin Deleon is a 45 y.o. female with a hx of COPD, depression, hyperthyroidism and now admitted with atrial fib who is being seen 10/25/2020 for the evaluation of atrial fib with RVR at the request of Dr. Louanne Belton.  History of Present Illness:   Erin Deleon with above hx and on visit with PCP to establish care found to be in a fib with RVR.  No awareness of rapid HR no chest pain.  She has been dyspneic for 2 weeks.  Dyspnea severe enough to have to stop shower and rest.  Hx of methimazole use and was scheduled for thyroid surgery but HR too high.  Has had lower ext edema.    In ER HR to 176.   EKG:  The EKG was personally reviewed and demonstrates:  atrial fib rate of 166 and no acute ST changes, compared to 5//2022 now in a fib from SR. Telemetry:  Telemetry was personally reviewed and demonstrates:  atrial fib rate now in 90s on dilt drip PCXR yesterday cardiomegaly with mild pulmonary vascular congestion.   Na 136, K+ 3.6 BUN 22, Cr 0.41 Mg+ 1.9  AST 93, ALT 35 Alk phose 129  Hgb 11.5 WBC 11.1 Plts 178 Echo pending.   TSH <0.010; free T4 >5.50, free T3 27.3 BNP 632  BP 99/44 to 124/73 P 115 to 93 afebrile, resp 30-29  sp02 on RA 95%   Past Medical History:  Diagnosis Date   COPD (chronic obstructive pulmonary disease) (HCC)    Depression    Thyroid disease     Past Surgical History:  Procedure Laterality Date   DILATION AND CURETTAGE OF UTERUS     left leg skin graft Left    right ovary surgery Right    had a mass that was removed surgically; ovary still in place   TONSILLECTOMY       Home meds  none  Inpatient Medications: Scheduled Meds:  Chlorhexidine Gluconate Cloth  6 each Topical Daily   heparin  5,000 Units  Subcutaneous Q8H   methimazole  20 mg Oral Daily   propranolol  40 mg Oral QID   Continuous Infusions:  diltiazem (CARDIZEM) infusion 12.5 mg/hr (10/25/20 0841)   methocarbamol (ROBAXIN) IV     PRN Meds: acetaminophen **OR** acetaminophen, albuterol, methocarbamol (ROBAXIN) IV, metoprolol tartrate, ondansetron **OR** ondansetron (ZOFRAN) IV, oxyCODONE  Allergies:   No Known Allergies  Social History:   Social History   Socioeconomic History   Marital status: Significant Other    Spouse name: Not on file   Number of children: 1   Years of education: Not on file   Highest education level: Not on file  Occupational History   Occupation: Unemployed  Tobacco Use   Smoking status: Former    Packs/day: 0.50    Types: Cigarettes   Smokeless tobacco: Never  Vaping Use   Vaping Use: Never used  Substance and Sexual Activity   Alcohol use: Not Currently    Comment: occ; none since thyroid has been off   Drug use: Never   Sexual activity: Yes    Birth control/protection: None  Other Topics Concern   Not on file  Social History Narrative   Son, 70   Social Determinants  of Health   Financial Resource Strain: Not on file  Food Insecurity: Not on file  Transportation Needs: Not on file  Physical Activity: Not on file  Stress: Not on file  Social Connections: Not on file  Intimate Partner Violence: Not on file    Family History:    Family History  Family history unknown: Yes     ROS:  Please see the history of present illness.  General:no colds or fevers, no weight changes Skin:no rashes or ulcers HEENT:no blurred vision, no congestion CV:see HPI PUL:see HPI GI:no diarrhea constipation or melena, no indigestion GU:no hematuria, no dysuria MS:no joint pain, no claudication Neuro:no syncope, no lightheadedness Endo:no diabetes, no thyroid disease  All other ROS reviewed and negative.     Physical Exam/Data:   Vitals:   10/25/20 0800 10/25/20 0830 10/25/20 0900  10/25/20 0930  BP: (!) 99/44 110/68 (!) 106/45 124/73  Pulse: (!) 107 (!) 119 90 (!) 37  Resp: (!) 26 (!) 31 (!) 31 (!) 30  Temp:      TempSrc:      SpO2: 99% 95% 94% 94%  Weight:      Height:        Intake/Output Summary (Last 24 hours) at 10/25/2020 1018 Last data filed at 10/25/2020 0930 Gross per 24 hour  Intake 476 ml  Output --  Net 476 ml   Last 3 Weights 10/25/2020 10/23/2020 10/23/2020  Weight (lbs) 276 lb 7.3 oz 281 lb 281 lb  Weight (kg) 125.4 kg 127.461 kg 127.461 kg     Body mass index is 43.3 kg/m.  Exam per Dr. Harl Bowie  Relevant CV Studies: Echo pending   Laboratory Data:  High Sensitivity Troponin:  No results for input(s): TROPONINIHS in the last 720 hours.   Chemistry Recent Labs  Lab 10/23/20 1702 10/24/20 0539 10/25/20 0530  NA 135 137 136  K 3.7 4.5 3.6  CL 105 104 104  CO2 20* 22 23  GLUCOSE 108* 125* 112*  BUN 10 15 22*  CREATININE 0.40* 0.39* 0.41*  CALCIUM 8.9 9.4 9.1  GFRNONAA >60 >60 >60  ANIONGAP _0 Recent Labs  Lab 10/23/20 1702 10/24/20 0539  PROT 6.8 6.4*  ALBUMIN 3.4* 3.2*  AST 24 93*  ALT 20 45*  ALKPHOS 136* 129*  BILITOT 2.7* 3.3*   Hematology Recent Labs  Lab 10/23/20 1702 10/24/20 0539 10/25/20 0530  WBC 11.5* 7.7 11.1*  RBC 4.60 4.60 4.46  HGB 12.1 11.9* 11.5*  HCT 37.6 38.1 36.6  MCV 81.7 82.8 82.1  MCH 26.3 25.9* 25.8*  MCHC 32.2 31.2 31.4  RDW 15.9* 15.9* 15.6*  PLT 200 180 178   BNP Recent Labs  Lab 10/23/20 1702  BNP 632.0*    DDimer No results for input(s): DDIMER in the last 168 hours.   Radiology/Studies:  Portable chest 1 View  Result Date: 10/24/2020 CLINICAL DATA:  Pulmonary vascular congestion EXAM: PORTABLE CHEST 1 VIEW COMPARISON:  10/23/2020 FINDINGS: Mild bilateral interstitial thickening. No focal consolidation. No pleural effusion or pneumothorax. Stable cardiomegaly. No acute osseous abnormality. IMPRESSION: Cardiomegaly with mild pulmonary vascular congestion.  Electronically Signed   By: Kathreen Devoid   On: 10/24/2020 06:26   DG Chest Port 1 View  Result Date: 10/23/2020 CLINICAL DATA:  45 year old female with shortness of breath and palpitation. EXAM: PORTABLE CHEST 1 VIEW COMPARISON:  Chest radiograph dated 08/15/2020. FINDINGS: Evaluation is limited due to overlying support wires and pads. There is cardiomegaly with  mild vascular congestion. Probable trace bilateral pleural effusions. No focal consolidation or pneumothorax. No acute osseous pathology. IMPRESSION: Cardiomegaly with mild vascular congestion. No focal consolidation. Electronically Signed   By: Anner Crete M.D.   On: 10/23/2020 16:38     Assessment and Plan:   Atrial fib with RVR in combination with thyrotoxicosis.  Pt has been non compliant to meds.  Has had SOB for 2 weeks.  But really unsure how long in atrial fib.  Pt has been started on methimazole and BB and dilt drip.  Off dilt HR went back up.  Will need anticoagulation -while CHA2DS2Vasc of 2 she will most likely be in a fib until thyroid is controlled. In addition with CHF will increase her score  will add IV heparin for now.  Echo pending Thyrotoxicosis back on methimazole will follow with endocrine. Acute CHF secondary to thyrotoxicosis and a fib with RVR.  Echo pending, concern for cardiomyopathy with rapid HR for unknown time.  Has rec'd lasix 20 mg IV twice.  And no I&O noted.  Elevated LFTs may be from CHF COPD rec'd steroids   Risk Assessment/Risk Scores:        New York Heart Association (NYHA) Functional Class NYHA Class III  CHA2DS2-VASc Score = 1  This indicates a 0.6% annual risk of stroke. The patient's score is based upon: CHF History: No HTN History: No Diabetes History: No Stroke History: No Vascular Disease History: No Age Score: 0 Gender Score: 1         For questions or updates, please contact [place] Please consult [email or web address] for contact info under    Signed, Cecilie Kicks,  NP  10/25/2020 10:18 AM  Patient seen and discussed with PA Dorene Ar, I agree with her documentation. 45 yo female history of COPD and hyperthyroidism admitted with severe thyroid toxicosis. Cardiology consulted for new onset of afib with RVR and clinical signs of heart failure       Admit labs WBC 11.5 Hgb 12.1 Plt 200 K 3.7 Cr 0.4 BUN 10 Tbili 2.7 BNP 632 TSH <0.010 Free TG >5.5 free T3 27.3 COVID neg CXR cardiomegaly, mild congestion EKG afib with RVR Echo pending    Physical exam Gen: NAD HEENT: sclera clear CV: irreg, tachy, no m/r/g. +JVD Pulm: crackles bilateral bases MSK: 2+ bilateral LE edema Neuro: no focal deficits Psych: normal affect     Patient admitted with severe thyrotoxicsosis. Cardiac manifestations include new diagnosis of afib with RVR, clinical signs of HF. Echo somewhat limited visualization but LVEF looks to be 40-45%, enlarged RV with low normal function. Would repeat limited study with echocontrast once rates better controlled. She is on IV lasix, would increase to 58m bid.Hold on any additional HF medications until HR's are controlled to allow room with bp. Dysfunction likely hyperthyroid vs tachy mediated, no plans for ischemic testing at this time.    Regarding the new onset afib she is on dilt gtt at 12.5 mg/hr, propanolol 48mqid. Rates 100s-110s. With mild LV dysfunction would look to get off dilt if at all possible, would avoid amio with her thyroid disease. Load IV digoxin 0.57m33m 1 then 0.257m63mery 6 hours x additional 2 doses for total of 1 mg. Wean dilt gtt as able.   Her CHADSVasc score is  2 (clinical CHF,gender). She may very well require cardioversion this admission, would go ahead and start anticoagulation with hep gtt and convert to oral eliquis later in admission.  Carlyle Dolly MD

## 2020-10-25 NOTE — Progress Notes (Signed)
ANTICOAGULATION CONSULT NOTE -   Pharmacy Consult for heparin  Indication: atrial fibrillation  No Known Allergies  Patient Measurements: Height: 5\' 7"  (170.2 cm) Weight: 125.4 kg (276 lb 7.3 oz) IBW/kg (Calculated) : 61.6 Heparin Dosing Weight: 92 kg  Vital Signs: BP: 106/59 (07/28 1800) Pulse Rate: 38 (07/28 1830)  Labs: Recent Labs    10/23/20 1702 10/23/20 1707 10/24/20 0539 10/25/20 0530 10/25/20 1812  HGB 12.1  --  11.9* 11.5*  --   HCT 37.6  --  38.1 36.6  --   PLT 200  --  180 178  --   APTT  --  34  --   --   --   LABPROT  --  16.3*  --   --   --   INR  --  1.3*  --   --   --   HEPARINUNFRC  --   --   --   --  <0.10*  CREATININE 0.40*  --  0.39* 0.41*  --      Estimated Creatinine Clearance: 122.1 mL/min (A) (by C-G formula based on SCr of 0.41 mg/dL (L)).   Medical History: Past Medical History:  Diagnosis Date   COPD (chronic obstructive pulmonary disease) (HCC)    Depression    Thyroid disease     Medications:  No medications prior to admission.    Assessment: Pharmacy consulted to dose heparin in patient with atrial fibrillation.  Patient is not on anticoagulation prior to admission.  HL <0.10 CBC WNL  Goal of Therapy:  Heparin level 0.3-0.7 units/ml Monitor platelets by anticoagulation protocol: Yes   Plan:  Rebolus 3000 units x 1 Increase heparin infusion to 1600 units/hr Check anti-Xa level in 6 hours and daily Continue to monitor H&H and platelets.   10/27/20, PharmD Clinical Pharmacist 10/25/2020 7:01 PM

## 2020-10-26 DIAGNOSIS — I5021 Acute systolic (congestive) heart failure: Secondary | ICD-10-CM

## 2020-10-26 LAB — BASIC METABOLIC PANEL
Anion gap: 8 (ref 5–15)
BUN: 17 mg/dL (ref 6–20)
CO2: 24 mmol/L (ref 22–32)
Calcium: 8.5 mg/dL — ABNORMAL LOW (ref 8.9–10.3)
Chloride: 104 mmol/L (ref 98–111)
Creatinine, Ser: 0.38 mg/dL — ABNORMAL LOW (ref 0.44–1.00)
GFR, Estimated: >60 mL/min (ref >60)
Glucose, Bld: 122 mg/dL — ABNORMAL HIGH (ref 70–99)
Potassium: 3.3 mmol/L — ABNORMAL LOW (ref 3.5–5.1)
Sodium: 136 mmol/L (ref 135–145)

## 2020-10-26 LAB — CBC
HCT: 34.7 % — ABNORMAL LOW (ref 36.0–46.0)
Hemoglobin: 10.8 g/dL — ABNORMAL LOW (ref 12.0–15.0)
MCH: 26.1 pg (ref 26.0–34.0)
MCHC: 31.1 g/dL (ref 30.0–36.0)
MCV: 83.8 fL (ref 80.0–100.0)
Platelets: 142 10*3/uL — ABNORMAL LOW (ref 150–400)
RBC: 4.14 MIL/uL (ref 3.87–5.11)
RDW: 15.7 % — ABNORMAL HIGH (ref 11.5–15.5)
WBC: 8.6 10*3/uL (ref 4.0–10.5)
nRBC: 0 % (ref 0.0–0.2)

## 2020-10-26 LAB — GLUCOSE, CAPILLARY: Glucose-Capillary: 77 mg/dL (ref 70–99)

## 2020-10-26 LAB — HEPARIN LEVEL (UNFRACTIONATED): Heparin Unfractionated: <0.1 IU/mL — ABNORMAL LOW (ref 0.30–0.70)

## 2020-10-26 MED ORDER — DIGOXIN 125 MCG PO TABS
0.2500 mg | ORAL_TABLET | Freq: Every day | ORAL | Status: DC
  Administered 2020-10-26 – 2020-10-29 (×4): 0.25 mg via ORAL
  Filled 2020-10-26 (×4): qty 2

## 2020-10-26 MED ORDER — HEPARIN BOLUS VIA INFUSION
3000.0000 [IU] | Freq: Once | INTRAVENOUS | Status: AC
  Administered 2020-10-26: 3000 [IU] via INTRAVENOUS
  Filled 2020-10-26: qty 3000

## 2020-10-26 MED ORDER — APIXABAN 5 MG PO TABS
5.0000 mg | ORAL_TABLET | Freq: Two times a day (BID) | ORAL | Status: DC
  Administered 2020-10-26 – 2020-10-29 (×7): 5 mg via ORAL
  Filled 2020-10-26 (×7): qty 1

## 2020-10-26 MED ORDER — POTASSIUM CHLORIDE CRYS ER 20 MEQ PO TBCR
40.0000 meq | EXTENDED_RELEASE_TABLET | Freq: Once | ORAL | Status: AC
  Administered 2020-10-26: 40 meq via ORAL
  Filled 2020-10-26: qty 2

## 2020-10-26 NOTE — Progress Notes (Addendum)
PROGRESS NOTE  [name] FXO:[id number] DOB: 09/19/1975 DOA: 10/23/2020 PCP: Heather Roberts, NP   LOS: 3 days   Brief narrative:  Erin Deleon  is a 45 y.o. female, with history of COPD, depression, hyperthyroidism, presented to the emergency department with complaints of tachycardia.  Patient was sent to the hospital by her PCP who noted that she had an abnormal EKG.  Patient denies any chest pain or palpitation.  She however had dyspnea for 2 weeks minimal exertion as well.  Patient was initially diagnosed with hyperthyroidism 11 years ago.   No history of thyroid storm, no history of being hospitalized for hyperthyroidism.  In the ED patient had tachycardia of 176 and was in atrial fibrillation with RVR.[name]  Patient received Decadron, Valium, Lopressor, propanolol, PTU given in the ED. patient was then admitted to the hospital for further evaluation and treatment.   Assessment/Plan:  Principal Problem:   Thyrotoxicosis Active Problems:   COPD (chronic obstructive pulmonary disease) (HCC)   Atrial fibrillation with RVR (HCC)   Acute CHF (congestive heart failure) (HCC)  Thyrotoxicosis Noncompliant to medications.  Patient received beta-blockers and  PTU in the ED.  Patient also received a Decadron and Valium.  Currently on  metamizole, beta-blockers .  Was on Cardizem drip due to atrial fibrillation which has been weaned off.  Will need to follow-up with endocrine as outpatient.  Patient does have history of Graves' disease.  Patient has not followed up with endocrine recently but has had follow-up in the past.TSH less than 0.010, free T4 greater than 5.50.   A. fib with RVR Secondary to thyrotoxicosis.  On beta-blockers and was on Cardizem drip.  Not a good candidate for amiodarone.  Has received digoxin and Cardizem has been weaned off due to reduced LV function. cardiology on board and will follow recommendations.  Patient has been transitioned to Eliquis from heparin drip.  Continue  digoxin daily.   Acute systolic CHF Secondary to thyrotoxicosis.  2 D Echocardiogram with reduced LV function with EF of 40-45%.  Received IV Lasix beta-blockers and digoxin.  Cardiology on board.     COPD Continue beta agonist.   Elevated bili Secondary to thyrotoxicosis  Morbid obesity.  Patient would benefit from weight loss as outpatient.  DVT prophylaxis: SCDs Start: 10/23/20 2114 apixaban (ELIQUIS) tablet 5 mg   Code Status: Full code  Family Communication: None  Status is: Inpatient  Remains inpatient appropriate because:IV treatments appropriate due to intensity of illness or inability to take PO and Inpatient level of care appropriate due to severity of illness  Dispo: The patient is from: Home              Anticipated d/c is to: Home              Patient currently is not medically stable to d/c.   Difficult to place patient No   Consultants: Cardiology  Procedures: None  Anti-infectives:  None  Anti-infectives (From admission, onward)    None      Subjective: Today, patient was seen and examined at bedside.  Complains of fatigue and weakness.  Shortness of breath and congestion has improved.  Denies any chest pain or dizziness.  Objective: Vitals:   10/26/20 1159 10/26/20 1200  BP:  114/61  Pulse:  81  Resp:  (!) 24  Temp: (!) 97.5 F (36.4 C)   SpO2:  96%    Intake/Output Summary (Last 24 hours) at 10/26/2020 1300 Last data filed at 10/26/2020  1203 Gross per 24 hour  Intake 534.86 ml  Output 1200 ml  Net -665.14 ml    Filed Weights   10/23/20 1539 10/25/20 0403 10/26/20 0427  Weight: 127.5 kg 125.4 kg 124.3 kg   Body mass index is 42.92 kg/m.   Physical Exam: General: Obese built,, not in obvious distress HENT:   No scleral pallor or icterus noted. Oral mucosa is moist.  Chest:    Diminished breath sounds bilaterally.  CVS: S1 &S2 heard. No murmur.  Irregularly irregular rhythm with tachycardia Abdomen: Soft, nontender,  nondistended.  Bowel sounds are heard.   Extremities: No cyanosis, clubbing but with trace bilateral lower extremity edema. peripheral pulses are palpable. Psych: Alert, awake and oriented, normal mood CNS:  No cranial nerve deficits.  Power equal in all extremities.   Skin: Warm and dry.  No rashes noted.   Data Review: I have personally reviewed the following laboratory data and studies,  CBC: Recent Labs  Lab 10/23/20 1702 10/24/20 0539 10/25/20 0530 10/26/20 0306  WBC 11.5* 7.7 11.1* 8.6  NEUTROABS 5.9  --   --   --   HGB 12.1 11.9* 11.5* 10.8*  HCT 37.6 38.1 36.6 34.7*  MCV 81.7 82.8 82.1 83.8  PLT 200 180 178 142*    Basic Metabolic Panel: Recent Labs  Lab 10/23/20 1702 10/24/20 0539 10/25/20 0530 10/26/20 1020  NA 135 137 136 136  K 3.7 4.5 3.6 3.3*  CL 105 104 104 104  CO2 20* 22 23 24   GLUCOSE 108* 125* 112* 122*  BUN 10 15 22* 17  CREATININE 0.40* 0.39* 0.41* 0.38*  CALCIUM 8.9 9.4 9.1 8.5*  MG  --   --  1.9  --   PHOS  --   --  3.3  --     Liver Function Tests: Recent Labs  Lab 10/23/20 1702 10/24/20 0539  AST 24 93*  ALT 20 45*  ALKPHOS 136* 129*  BILITOT 2.7* 3.3*  PROT 6.8 6.4*  ALBUMIN 3.4* 3.2*    No results for input(s): LIPASE, AMYLASE in the last 168 hours. No results for input(s): AMMONIA in the last 168 hours. Cardiac Enzymes: No results for input(s): CKTOTAL, CKMB, CKMBINDEX, TROPONINI in the last 168 hours. BNP (last 3 results) Recent Labs    10/23/20 1702  BNP 632.0*     ProBNP (last 3 results) No results for input(s): PROBNP in the last 8760 hours.  CBG: Recent Labs  Lab 10/23/20 1643 10/24/20 0856 10/25/20 0732 10/26/20 0730  GLUCAP 94 119* 100* 77    Recent Results (from the past 240 hour(s))  Culture, blood (Routine X 2) w Reflex to ID Panel     Status: None (Preliminary result)   Collection Time: 10/23/20  5:02 PM   Specimen: BLOOD LEFT HAND  Result Value Ref Range Status   Specimen Description BLOOD  LEFT HAND  Final   Special Requests   Final    Blood Culture adequate volume BOTTLES DRAWN AEROBIC AND ANAEROBIC   Culture   Final    NO GROWTH 3 DAYS Performed at [facility], [address], [place], [place]    Report Status PENDING  Incomplete  Culture, blood (Routine X 2) w Reflex to ID Panel     Status: None (Preliminary result)   Collection Time: 10/23/20  5:02 PM   Specimen: BLOOD RIGHT ARM  Result Value Ref Range Status   Specimen Description BLOOD RIGHT ARM  Final   Special Requests  Final    Blood Culture results may not be optimal due to an excessive volume of blood received in culture bottles BOTTLES DRAWN AEROBIC ONLY   Culture   Final    NO GROWTH 3 DAYS Performed at [facility], [address], [place], [place] [address]    Report Status PENDING  Incomplete  Resp Panel by RT-PCR (Flu A&B, Covid) Nasopharyngeal Swab     Status: None   Collection Time: 10/23/20  5:34 PM   Specimen: Nasopharyngeal Swab; Nasopharyngeal(NP) swabs in vial transport medium  Result Value Ref Range Status   SARS Coronavirus 2 by RT PCR NEGATIVE NEGATIVE Final    Comment: (NOTE) SARS-CoV-2 target nucleic acids are NOT DETECTED.  The SARS-CoV-2 RNA is generally detectable in upper respiratory specimens during the acute phase of infection. The lowest concentration of SARS-CoV-2 viral copies this assay can detect is 138 copies/mL. A negative result does not preclude SARS-Cov-2 infection and should not be used as the sole basis for treatment or other patient management decisions. A negative result may occur with  improper specimen collection/handling, submission of specimen other than nasopharyngeal swab, presence of viral mutation(s) within the areas targeted by this assay, and inadequate number of viral copies(<138 copies/mL). A negative result must be combined with clinical observations, patient history, and epidemiological information. The expected result is Negative.  Fact  Sheet for Patients:  BloggerCourse.com  Fact Sheet for Healthcare Providers:  SeriousBroker.it  This test is no t yet approved or cleared by the [place] FDA and  has been authorized for detection and/or diagnosis of SARS-CoV-2 by FDA under an Emergency Use Authorization (EUA). This EUA will remain  in effect (meaning this test can be used) for the duration of the COVID-19 declaration under Section 564(b)(1) of the Act, 21 U.S.C.section 360bbb-3(b)(1), unless the authorization is terminated  or revoked sooner.       Influenza A by PCR NEGATIVE NEGATIVE Final   Influenza B by PCR NEGATIVE NEGATIVE Final    Comment: (NOTE) The Xpert Xpress SARS-CoV-2/FLU/RSV plus assay is intended as an aid in the diagnosis of influenza from Nasopharyngeal swab specimens and should not be used as a sole basis for treatment. Nasal washings and aspirates are unacceptable for Xpert Xpress SARS-CoV-2/FLU/RSV testing.  Fact Sheet for Patients: BloggerCourse.com  Fact Sheet for Healthcare Providers: SeriousBroker.it  This test is not yet approved or cleared by the [place] FDA and has been authorized for detection and/or diagnosis of SARS-CoV-2 by FDA under an Emergency Use Authorization (EUA). This EUA will remain in effect (meaning this test can be used) for the duration of the COVID-19 declaration under Section 564(b)(1) of the Act, 21 U.S.C. section 360bbb-3(b)(1), unless the authorization is terminated or revoked.  Performed at [facility], [address], [place], [place] [address]   MRSA Next Gen by PCR, Nasal     Status: None   Collection Time: 10/24/20  9:38 AM   Specimen: Nasal Mucosa; Nasal Swab  Result Value Ref Range Status   MRSA by PCR Next Gen NOT DETECTED NOT DETECTED Final    Comment: (NOTE) The GeneXpert MRSA Assay (FDA approved for NASAL specimens only), is one  component of a comprehensive MRSA colonization surveillance program. It is not intended to diagnose MRSA infection nor to guide or monitor treatment for MRSA infections. Test performance is not FDA approved in patients less than 1 years old. Performed at [facility], [address], [place], [place] [address]  Studies: ECHOCARDIOGRAM COMPLETE  Result Date: 10/25/2020    ECHOCARDIOGRAM REPORT   Patient Name:   Baruch MerlCY Eunice Date of Exam: 10/25/2020 Medical Rec #:  [id number]   Height:       67.0 in Accession #:    [id number]  Weight:       276.5 lb Date of Birth:  March 24, 1976   BSA:          2.321 m Patient Age:    45 years    BP:           113/76 mmHg Patient Gender: F           HR:           102 bpm. [facility] Procedure: 2D Echo, Cardiac Doppler and Color Doppler Indications:     CHF  History:         Patient has no prior history of Echocardiogram examinations.                  CHF, COPD, Arrythmias:Atrial Fibrillation and Tachycardia;                  Signs/Symptoms:Dyspnea.  Sonographer:     Mikki Harbororothy Buchanan Referring Phys:  [id number] ASIA B ZIERLE-GHOSH Diagnosing Phys: Dina RichJonathan Branch MD IMPRESSIONS  1. Somewhat limited visualization of LV endocardium, grossly LVEF appears around 40-45%. Would recommend limited contrast study once heart rates are better controlled. . Left ventricular ejection fraction, by estimation, is 40 to 45%. The left ventricle  has mildly decreased function. The left ventricle demonstrates global hypokinesis. Left ventricular diastolic parameters are indeterminate.  2. Right ventricular systolic function is low normal. The right ventricular size is moderately enlarged. Tricuspid regurgitation signal is inadequate for assessing PA pressure.  3. Left atrial size was mildly dilated.  4. Right atrial size was mildly dilated.  5. A small pericardial effusion is present. The pericardial effusion is circumferential.  6. The mitral valve is normal in structure. Mild  mitral valve regurgitation. No evidence of mitral stenosis.  7. The tricuspid valve is abnormal. Tricuspid valve regurgitation is mild to moderate.  8. The aortic valve has an indeterminant number of cusps. Aortic valve regurgitation is not visualized. No aortic stenosis is present.  9. The inferior vena cava is dilated in size with <50% respiratory variability, suggesting right atrial pressure of 15 mmHg. FINDINGS  Left Ventricle: Somewhat limited visualization of LV endocardium, grossly LVEF appears around 40-45%. Would recommend limited contrast study once heart rates are better controlled. Left ventricular ejection fraction, by estimation, is 40 to 45%. The left ventricle has mildly decreased function. The left ventricle demonstrates global hypokinesis. The left ventricular internal cavity size was normal in size. There is no left ventricular hypertrophy. Left ventricular diastolic parameters are indeterminate. Right Ventricle: The venticular septum is flattened in systole and diastole suggesting RV pressure and ovleroad. The right ventricular size is moderately enlarged. Right vetricular wall thickness was not assessed. Right ventricular systolic function is low normal. Tricuspid regurgitation signal is inadequate for assessing PA pressure. Left Atrium: Left atrial size was mildly dilated. Right Atrium: Right atrial size was mildly dilated. Pericardium: A small pericardial effusion is present. The pericardial effusion is circumferential. Mitral Valve: The mitral valve is normal in structure. Mild mitral valve regurgitation. No evidence of mitral valve stenosis. MV peak gradient, 7.3 mmHg. The mean mitral valve gradient is 1.0 mmHg. Tricuspid Valve: The tricuspid valve is abnormal. Tricuspid valve regurgitation is mild to moderate. No  evidence of tricuspid stenosis. Aortic Valve: The aortic valve has an indeterminant number of cusps. Aortic valve regurgitation is not visualized. No aortic stenosis is present.  Aortic valve mean gradient measures 6.0 mmHg. Aortic valve peak gradient measures 9.9 mmHg. Aortic valve area, by VTI measures 2.83 cm. Pulmonic Valve: The pulmonic valve was not well visualized. Pulmonic valve regurgitation is not visualized. No evidence of pulmonic stenosis. Aorta: The aortic root is normal in size and structure. Venous: The inferior vena cava is dilated in size with less than 50% respiratory variability, suggesting right atrial pressure of 15 mmHg. IAS/Shunts: No atrial level shunt detected by color flow Doppler.  LEFT VENTRICLE PLAX 2D LVIDd:         5.02 cm  Diastology LVIDs:         3.81 cm  LV e' medial:    6.83 cm/s LV PW:         0.80 cm  LV E/e' medial:  17.1 LV IVS:        0.64 cm  LV e' lateral:   11.30 cm/s LVOT diam:     2.00 cm  LV E/e' lateral: 10.4 LV SV:         72 LV SV Index:   31 LVOT Area:     3.14 cm  RIGHT VENTRICLE RV Basal diam:  5.15 cm RV Mid diam:    4.05 cm RV S prime:     14.40 cm/s TAPSE (M-mode): 2.4 cm LEFT ATRIUM             Index       RIGHT ATRIUM           Index LA diam:        5.60 cm 2.41 cm/m  RA Area:     24.60 cm LA Vol (A2C):   95.1 ml 40.98 ml/m RA Volume:   77.40 ml  33.35 ml/m LA Vol (A4C):   79.7 ml 34.35 ml/m LA Biplane Vol: 88.5 ml 38.14 ml/m  AORTIC VALVE AV Area (Vmax):    2.56 cm AV Area (Vmean):   2.43 cm AV Area (VTI):     2.83 cm AV Vmax:           157.00 cm/s AV Vmean:          110.667 cm/s AV VTI:            0.255 m AV Peak Grad:      9.9 mmHg AV Mean Grad:      6.0 mmHg LVOT Vmax:         128.00 cm/s LVOT Vmean:        85.667 cm/s LVOT VTI:          0.230 m LVOT/AV VTI ratio: 0.90  AORTA Ao Root diam: 3.00 cm MITRAL VALVE MV Area (PHT): 3.37 cm     SHUNTS MV Area VTI:   3.03 cm     Systemic VTI:  0.23 m MV Peak grad:  7.3 mmHg     Systemic Diam: 2.00 cm MV Mean grad:  1.0 mmHg MV Vmax:       1.35 m/s MV Vmean:      39.2 cm/s MV Decel Time: 225 msec MV E velocity: 117.00 cm/s Dina Rich MD Electronically signed by Dina Rich MD Signature Date/Time: 10/25/2020/11:03:18 AM    Final (Updated)       Joycelyn Das, MD  [facility] 10/26/2020  If 7PM-7AM, please contact night-coverage

## 2020-10-26 NOTE — Progress Notes (Signed)
Progress Note  Patient Name: Erin Deleon Date of Encounter: 10/26/2020  [facility] Cardiologist: New  Subjective   Fatigued this morning, not much sleep. Denies significant SOB  Inpatient Medications    Scheduled Meds:  Chlorhexidine Gluconate Cloth  6 each Topical Daily   furosemide  40 mg Intravenous BID   methimazole  20 mg Oral Daily   propranolol  40 mg Oral QID   Continuous Infusions:  diltiazem (CARDIZEM) infusion Stopped (10/25/20 1730)   heparin 1,800 Units/hr (10/26/20 0415)   methocarbamol (ROBAXIN) IV     PRN Meds: acetaminophen **OR** acetaminophen, albuterol, methocarbamol (ROBAXIN) IV, metoprolol tartrate, ondansetron **OR** ondansetron (ZOFRAN) IV, oxyCODONE   Vital Signs    Vitals:   10/26/20 0200 10/26/20 0300 10/26/20 0400 10/26/20 0427  BP: 112/72 (!) 94/45 (!) 103/42   Pulse: (!) 126 91 83   Resp: (!) 29 (!) 23 (!) 25   Temp:      TempSrc:      SpO2: 96% 95% 92%   Weight:    124.3 kg  Height:        Intake/Output Summary (Last 24 hours) at 10/26/2020 0919 Last data filed at 10/26/2020 0415 Gross per 24 hour  Intake 843.34 ml  Output 1900 ml  Net -1056.66 ml   Last 3 Weights 10/26/2020 10/25/2020 10/23/2020  Weight (lbs) 274 lb 0.5 oz 276 lb 7.3 oz 281 lb  Weight (kg) 124.3 kg 125.4 kg 127.461 kg      Telemetry    Afib 90s-100s - Personally Reviewed  ECG    N/a - Personally Reviewed  Physical Exam   GEN: No acute distress.   Neck: No JVD Cardiac: irreg, no murmurs, rubs, or gallops.  Respiratory: Clear to auscultation bilaterally. GI: Soft, nontender, non-distended  MS: 2+ bilateral LE edema; No deformity. Neuro:  Nonfocal  Psych: Normal affect   Labs    High Sensitivity Troponin:  No results for input(s): TROPONINIHS in the last 720 hours.    Chemistry Recent Labs  Lab 10/23/20 1702 10/24/20 0539 10/25/20 0530  NA 135 137 136  K 3.7 4.5 3.6  CL 105 104 104  CO2 20* 22 23  GLUCOSE 108* 125* 112*  BUN 10 15  22*  CREATININE 0.40* 0.39* 0.41*  CALCIUM 8.9 9.4 9.1  PROT 6.8 6.4*  --   ALBUMIN 3.4* 3.2*  --   AST 24 93*  --   ALT 20 45*  --   ALKPHOS 136* 129*  --   BILITOT 2.7* 3.3*  --   GFRNONAA >60 >60 >60  ANIONGAP 10 11 9      Hematology Recent Labs  Lab 10/24/20 0539 10/25/20 0530 10/26/20 0306  WBC 7.7 11.1* 8.6  RBC 4.60 4.46 4.14  HGB 11.9* 11.5* 10.8*  HCT 38.1 36.6 34.7*  MCV 82.8 82.1 83.8  MCH 25.9* 25.8* 26.1  MCHC 31.2 31.4 31.1  RDW 15.9* 15.6* 15.7*  PLT 180 178 142*    BNP Recent Labs  Lab 10/23/20 1702  BNP 632.0*     DDimer No results for input(s): DDIMER in the last 168 hours.   Radiology    ECHOCARDIOGRAM COMPLETE  Result Date: 10/25/2020    ECHOCARDIOGRAM REPORT   Patient Name:   RUBLE Deleon Date of Exam: 10/25/2020 Medical Rec #:  10/27/2020   Height:       67.0 in Accession #:    [id number]  Weight:       276.5 lb Date of Birth:  Mar 25, 1976   BSA:          2.321 m Patient Age:    45 years    BP:           113/76 mmHg Patient Gender: F           HR:           102 bpm. [facility] Procedure: 2D Echo, Cardiac Doppler and Color Doppler Indications:     CHF  History:         Patient has no prior history of Echocardiogram examinations.                  CHF, COPD, Arrythmias:Atrial Fibrillation and Tachycardia;                  Signs/Symptoms:Dyspnea.  Sonographer:     Mikki Harbor Referring Phys:  [id number] ASIA B ZIERLE-GHOSH Diagnosing Phys: Dina Rich MD IMPRESSIONS  1. Somewhat limited visualization of LV endocardium, grossly LVEF appears around 40-45%. Would recommend limited contrast study once heart rates are better controlled. . Left ventricular ejection fraction, by estimation, is 40 to 45%. The left ventricle  has mildly decreased function. The left ventricle demonstrates global hypokinesis. Left ventricular diastolic parameters are indeterminate.  2. Right ventricular systolic function is low normal. The right ventricular size is  moderately enlarged. Tricuspid regurgitation signal is inadequate for assessing PA pressure.  3. Left atrial size was mildly dilated.  4. Right atrial size was mildly dilated.  5. A small pericardial effusion is present. The pericardial effusion is circumferential.  6. The mitral valve is normal in structure. Mild mitral valve regurgitation. No evidence of mitral stenosis.  7. The tricuspid valve is abnormal. Tricuspid valve regurgitation is mild to moderate.  8. The aortic valve has an indeterminant number of cusps. Aortic valve regurgitation is not visualized. No aortic stenosis is present.  9. The inferior vena cava is dilated in size with <50% respiratory variability, suggesting right atrial pressure of 15 mmHg. FINDINGS  Left Ventricle: Somewhat limited visualization of LV endocardium, grossly LVEF appears around 40-45%. Would recommend limited contrast study once heart rates are better controlled. Left ventricular ejection fraction, by estimation, is 40 to 45%. The left ventricle has mildly decreased function. The left ventricle demonstrates global hypokinesis. The left ventricular internal cavity size was normal in size. There is no left ventricular hypertrophy. Left ventricular diastolic parameters are indeterminate. Right Ventricle: The venticular septum is flattened in systole and diastole suggesting RV pressure and ovleroad. The right ventricular size is moderately enlarged. Right vetricular wall thickness was not assessed. Right ventricular systolic function is low normal. Tricuspid regurgitation signal is inadequate for assessing PA pressure. Left Atrium: Left atrial size was mildly dilated. Right Atrium: Right atrial size was mildly dilated. Pericardium: A small pericardial effusion is present. The pericardial effusion is circumferential. Mitral Valve: The mitral valve is normal in structure. Mild mitral valve regurgitation. No evidence of mitral valve stenosis. MV peak gradient, 7.3 mmHg. The mean  mitral valve gradient is 1.0 mmHg. Tricuspid Valve: The tricuspid valve is abnormal. Tricuspid valve regurgitation is mild to moderate. No evidence of tricuspid stenosis. Aortic Valve: The aortic valve has an indeterminant number of cusps. Aortic valve regurgitation is not visualized. No aortic stenosis is present. Aortic valve mean gradient measures 6.0 mmHg. Aortic valve peak gradient measures 9.9 mmHg. Aortic valve area, by VTI measures 2.83 cm. Pulmonic Valve: The pulmonic valve was not well visualized. Pulmonic valve  regurgitation is not visualized. No evidence of pulmonic stenosis. Aorta: The aortic root is normal in size and structure. Venous: The inferior vena cava is dilated in size with less than 50% respiratory variability, suggesting right atrial pressure of 15 mmHg. IAS/Shunts: No atrial level shunt detected by color flow Doppler.  LEFT VENTRICLE PLAX 2D LVIDd:         5.02 cm  Diastology LVIDs:         3.81 cm  LV e' medial:    6.83 cm/s LV PW:         0.80 cm  LV E/e' medial:  17.1 LV IVS:        0.64 cm  LV e' lateral:   11.30 cm/s LVOT diam:     2.00 cm  LV E/e' lateral: 10.4 LV SV:         72 LV SV Index:   31 LVOT Area:     3.14 cm  RIGHT VENTRICLE RV Basal diam:  5.15 cm RV Mid diam:    4.05 cm RV S prime:     14.40 cm/s TAPSE (M-mode): 2.4 cm LEFT ATRIUM             Index       RIGHT ATRIUM           Index LA diam:        5.60 cm 2.41 cm/m  RA Area:     24.60 cm LA Vol (A2C):   95.1 ml 40.98 ml/m RA Volume:   77.40 ml  33.35 ml/m LA Vol (A4C):   79.7 ml 34.35 ml/m LA Biplane Vol: 88.5 ml 38.14 ml/m  AORTIC VALVE AV Area (Vmax):    2.56 cm AV Area (Vmean):   2.43 cm AV Area (VTI):     2.83 cm AV Vmax:           157.00 cm/s AV Vmean:          110.667 cm/s AV VTI:            0.255 m AV Peak Grad:      9.9 mmHg AV Mean Grad:      6.0 mmHg LVOT Vmax:         128.00 cm/s LVOT Vmean:        85.667 cm/s LVOT VTI:          0.230 m LVOT/AV VTI ratio: 0.90  AORTA Ao Root diam: 3.00 cm MITRAL  VALVE MV Area (PHT): 3.37 cm     SHUNTS MV Area VTI:   3.03 cm     Systemic VTI:  0.23 m MV Peak grad:  7.3 mmHg     Systemic Diam: 2.00 cm MV Mean grad:  1.0 mmHg MV Vmax:       1.35 m/s MV Vmean:      39.2 cm/s MV Decel Time: 225 msec MV E velocity: 117.00 cm/s Dina Rich MD Electronically signed by Dina Rich MD Signature Date/Time: 10/25/2020/11:03:18 AM    Final (Updated)     Cardiac Studies     Patient Profile     Lorielle Boehning is a 45 y.o. female with a hx of COPD, depression, hyperthyroidism and now admitted with atrial fib who is being seen 10/25/2020 for the evaluation of atrial fib with RVR at the request of Dr. Tyson Babinski.  Assessment & Plan  1.Afib with RVR - new diagnosis this admission in setting of severe hyperthyroidism - on propranolol and dilt gtt initially. With evidence of systolic  dysfunction and soft bp's weaned dilt drip, loaded with IV digoxin yesterday and continued propranolol. Start oral digoxin 0.25mg  dailyTrying to avoid amio given her thyroid disease - CHADS2Vasc score is 2, started on hep gtt. Will transition to eliquis today.    2. Acute sysotlic HF - in setting of severe hyperthyroidism, afib with RVR - echo LVEF 40-45% though limited visualization, would get contrast limited study once rates controlled. Enlarged RV with low normal function - neg 1L yesterday, she is on lasix 40mg  bid. Labs are pending today  -would await limited echo w/ contrast findings once rates controlled before committing to full systolic HF medication regimen. Actually at this time bp's would not tolerate additional agents as well.  -with RV enlargement, HF, arrhythmia, body habitus would consider outpatient sleep study   3. Hyperthyroidism TSH <0.010 Free TG >5.5 free T3 27.3 - per primary team   For questions or updates, please contact [facility] Please consult [email or web address] for contact info under        Signed, Dina Rich, MD  10/26/2020, 9:19 AM

## 2020-10-26 NOTE — Progress Notes (Signed)
ANTICOAGULATION CONSULT NOTE   Pharmacy Consult for Heparin Indication: atrial fibrillation  No Known Allergies  Patient Measurements: Height: 5\' 7"  (170.2 cm) Weight: 125.4 kg (276 lb 7.3 oz) IBW/kg (Calculated) : 61.6 Heparin Dosing Weight: 92 kg  Vital Signs: Temp: 97.6 F (36.4 C) (07/28 2300) BP: 101/61 (07/29 0100) Pulse Rate: 68 (07/29 0100)  Labs: Recent Labs    10/23/20 1702 10/23/20 1707 10/24/20 0539 10/25/20 0530 10/25/20 1812 10/26/20 0140  HGB 12.1  --  11.9* 11.5*  --   --   HCT 37.6  --  38.1 36.6  --   --   PLT 200  --  180 178  --   --   APTT  --  34  --   --   --   --   LABPROT  --  16.3*  --   --   --   --   INR  --  1.3*  --   --   --   --   HEPARINUNFRC  --   --   --   --  <0.10* <0.10*  CREATININE 0.40*  --  0.39* 0.41*  --   --      Estimated Creatinine Clearance: 122.1 mL/min (A) (by C-G formula based on SCr of 0.41 mg/dL (L)).   Medical History: Past Medical History:  Diagnosis Date   COPD (chronic obstructive pulmonary disease) (HCC)    Depression    Thyroid disease     Medications:  No medications prior to admission.    Assessment: Pharmacy consulted to dose heparin in patient with atrial fibrillation.  Patient is not on anticoagulation prior to admission.   7/29 AM update:  Heparin level remains low   Goal of Therapy:  Heparin level 0.3-0.7 units/ml Monitor platelets by anticoagulation protocol: Yes   Plan:  Re-bolus Heparin 3000 units IV x 1 Increase heparin infusion to 1800 units/hr Check anti-Xa level in 6-8 hours and daily Continue to monitor H&H and platelets.  8/29, PharmD, BCPS Clinical Pharmacist Phone: [phone]

## 2020-10-27 LAB — CBC
HCT: 33.4 % — ABNORMAL LOW (ref 36.0–46.0)
Hemoglobin: 10.6 g/dL — ABNORMAL LOW (ref 12.0–15.0)
MCH: 26 pg (ref 26.0–34.0)
MCHC: 31.7 g/dL (ref 30.0–36.0)
MCV: 81.9 fL (ref 80.0–100.0)
Platelets: 170 10*3/uL (ref 150–400)
RBC: 4.08 MIL/uL (ref 3.87–5.11)
RDW: 15.9 % — ABNORMAL HIGH (ref 11.5–15.5)
WBC: 7.4 10*3/uL (ref 4.0–10.5)
nRBC: 0 % (ref 0.0–0.2)

## 2020-10-27 LAB — PHOSPHORUS: Phosphorus: 4.4 mg/dL (ref 2.5–4.6)

## 2020-10-27 LAB — BASIC METABOLIC PANEL
Anion gap: 8 (ref 5–15)
BUN: 12 mg/dL (ref 6–20)
CO2: 28 mmol/L (ref 22–32)
Calcium: 8.4 mg/dL — ABNORMAL LOW (ref 8.9–10.3)
Chloride: 102 mmol/L (ref 98–111)
Creatinine, Ser: 0.34 mg/dL — ABNORMAL LOW (ref 0.44–1.00)
GFR, Estimated: >60 mL/min (ref >60)
Glucose, Bld: 89 mg/dL (ref 70–99)
Potassium: 3.4 mmol/L — ABNORMAL LOW (ref 3.5–5.1)
Sodium: 138 mmol/L (ref 135–145)

## 2020-10-27 LAB — MAGNESIUM: Magnesium: 1.8 mg/dL (ref 1.7–2.4)

## 2020-10-27 LAB — GLUCOSE, CAPILLARY: Glucose-Capillary: 72 mg/dL (ref 70–99)

## 2020-10-27 LAB — BRAIN NATRIURETIC PEPTIDE: B Natriuretic Peptide: 1004 pg/mL — ABNORMAL HIGH (ref 0.0–100.0)

## 2020-10-27 MED ORDER — ATENOLOL 25 MG PO TABS
12.5000 mg | ORAL_TABLET | Freq: Two times a day (BID) | ORAL | Status: DC
  Administered 2020-10-27 – 2020-10-28 (×4): 12.5 mg via ORAL
  Filled 2020-10-27 (×5): qty 1

## 2020-10-27 MED ORDER — POTASSIUM CHLORIDE CRYS ER 20 MEQ PO TBCR
40.0000 meq | EXTENDED_RELEASE_TABLET | Freq: Two times a day (BID) | ORAL | Status: AC
  Administered 2020-10-27 – 2020-10-28 (×3): 40 meq via ORAL
  Filled 2020-10-27 (×3): qty 2

## 2020-10-27 MED ORDER — MAGNESIUM OXIDE -MG SUPPLEMENT 400 (240 MG) MG PO TABS
400.0000 mg | ORAL_TABLET | Freq: Two times a day (BID) | ORAL | Status: DC
  Administered 2020-10-27 – 2020-10-29 (×5): 400 mg via ORAL
  Filled 2020-10-27 (×5): qty 1

## 2020-10-27 MED ORDER — FUROSEMIDE 10 MG/ML IJ SOLN
40.0000 mg | Freq: Every day | INTRAMUSCULAR | Status: DC
  Administered 2020-10-28 – 2020-10-29 (×2): 40 mg via INTRAVENOUS
  Filled 2020-10-27 (×2): qty 4

## 2020-10-27 NOTE — Progress Notes (Signed)
PROGRESS NOTE  Erin Deleon QMG:[id number] DOB: [birth year] DOA: 10/23/2020 PCP: Heather Roberts, NP   LOS: 4 days   Brief narrative:  Erin Deleon  is a 46 y.o. female, with history of COPD, depression, hyperthyroidism, presented to the emergency department with complaints of tachycardia.  Patient was sent to the hospital by her PCP who noted that she had an abnormal EKG.  Patient denies any chest pain or palpitation.  She however had dyspnea for 2 weeks minimal exertion as well.  Patient was initially diagnosed with hyperthyroidism 11 years ago.   No history of thyroid storm, no history of being hospitalized for hyperthyroidism.  In the ED patient had tachycardia of 176 and was in atrial fibrillation with RVR.[name]  Patient received Decadron, Valium, Lopressor, propanolol, PTU given in the ED. patient was then admitted to the hospital for further evaluation and treatment.   Assessment/Plan:  Principal Problem:   Thyrotoxicosis Active Problems:   COPD (chronic obstructive pulmonary disease) (HCC)   Atrial fibrillation with RVR (HCC)   Acute CHF (congestive heart failure) (HCC)  Thyrotoxicosis Noncompliant to medications.  Patient received beta-blockers and  PTU in the ED.  Patient received Decadron and Valium.  Currently on  metamizole, beta-blockers .  Was on Cardizem drip due to atrial fibrillation which has been weaned off.  Will need to follow-up with endocrine as outpatient.  Patient does have history of Graves' disease.  Patient has not followed up with endocrine recently but has had follow-up in the past.TSH less than 0.010, free T4 greater than 5.50.  Will change her propanolol to atenolol from today.  Hypokalemia.  We will replenish.  Check levels in a.m.   A. fib with RVR Secondary to thyrotoxicosis.  On beta-blockers and was on Cardizem drip.  Not a good candidate for amiodarone.  Has received digoxin and Cardizem has been weaned off Cardizem due to reduced LV function.  Currently on  daily digoxin and Eliquis.  We will change propranolol to atenolol for better dosing.    Acute systolic CHF Secondary to thyrotoxicosis.  2 D Echocardiogram with reduced LV function with EF of 40-45%.  Received IV Lasix beta-blockers and digoxin.  Cardiology on board.     COPD Continue beta agonist.   Elevated bili Secondary to thyrotoxicosis  Morbid obesity.  Patient would benefit from weight loss as outpatient.  Debility, deconditioning.  We will ambulate the patient.  DVT prophylaxis: SCDs Start: 10/23/20 2114 apixaban (ELIQUIS) tablet 5 mg   Code Status: Full code  Family Communication: None  Status is: Inpatient  Remains inpatient appropriate because:IV treatments appropriate due to intensity of illness or inability to take PO and Inpatient level of care appropriate due to severity of illness  Dispo: The patient is from: Home              Anticipated d/c is to: Home              Patient currently is not medically stable to d/c.   Difficult to place patient No   Consultants: Cardiology  Procedures: None  Anti-infectives:  None  Anti-infectives (From admission, onward)    None      Subjective:  Today, patient was seen and examined at bedside.  Patient states that her fatigue has improved.  Denies much shortness of breath.  Objective: Vitals:   10/27/20 1000 10/27/20 1135  BP: 119/77   Pulse: 85 (!) 54  Resp: (!) 25 17  Temp:  98.7 F (37.1 C)  SpO2: 94% 96%    Intake/Output Summary (Last 24 hours) at 10/27/2020 1158 Last data filed at 10/26/2020 1800 Gross per 24 hour  Intake 254.86 ml  Output 1200 ml  Net -945.14 ml    Filed Weights   10/25/20 0403 10/26/20 0427 10/27/20 0346  Weight: 125.4 kg 124.3 kg 123.8 kg   Body mass index is 42.75 kg/m.   Physical Exam: General: Obese built,, not in obvious distress HENT:   No scleral pallor or icterus noted. Oral mucosa is moist.  Chest:    Diminished breath sounds bilaterally.  CVS: S1 &S2  heard. No murmur.  Irregularly irregular rhythm with tachycardia Abdomen: Soft, nontender, nondistended.  Bowel sounds are heard.   Extremities: No cyanosis, clubbing but with trace bilateral lower extremity edema. peripheral pulses are palpable. Psych: Alert, awake and oriented, normal mood CNS:  No cranial nerve deficits.  Power equal in all extremities.   Skin: Warm and dry.  No rashes noted.   Data Review: I have personally reviewed the following laboratory data and studies,  CBC: Recent Labs  Lab 10/23/20 1702 10/24/20 0539 10/25/20 0530 10/26/20 0306 10/27/20 0331  WBC 11.5* 7.7 11.1* 8.6 7.4  NEUTROABS 5.9  --   --   --   --   HGB 12.1 11.9* 11.5* 10.8* 10.6*  HCT 37.6 38.1 36.6 34.7* 33.4*  MCV 81.7 82.8 82.1 83.8 81.9  PLT 200 180 178 142* 170    Basic Metabolic Panel: Recent Labs  Lab 10/23/20 1702 10/24/20 0539 10/25/20 0530 10/26/20 1020 10/27/20 0331  NA 135 137 136 136 138  K 3.7 4.5 3.6 3.3* 3.4*  CL 105 104 104 104 102  CO2 20* 22 23 24 28   GLUCOSE 108* 125* 112* 122* 89  BUN 10 15 22* 17 12  CREATININE 0.40* 0.39* 0.41* 0.38* 0.34*  CALCIUM 8.9 9.4 9.1 8.5* 8.4*  MG  --   --  1.9  --  1.8  PHOS  --   --  3.3  --  4.4    Liver Function Tests: Recent Labs  Lab 10/23/20 1702 10/24/20 0539  AST 24 93*  ALT 20 45*  ALKPHOS 136* 129*  BILITOT 2.7* 3.3*  PROT 6.8 6.4*  ALBUMIN 3.4* 3.2*    No results for input(s): LIPASE, AMYLASE in the last 168 hours. No results for input(s): AMMONIA in the last 168 hours. Cardiac Enzymes: No results for input(s): CKTOTAL, CKMB, CKMBINDEX, TROPONINI in the last 168 hours. BNP (last 3 results) Recent Labs    10/23/20 1702 10/27/20 0331  BNP 632.0* 1,004.0*     ProBNP (last 3 results) No results for input(s): PROBNP in the last 8760 hours.  CBG: Recent Labs  Lab 10/23/20 1643 10/24/20 0856 10/25/20 0732 10/26/20 0730 10/27/20 0749  GLUCAP 94 119* 100* 77 72    Recent Results (from the past  240 hour(s))  Culture, blood (Routine X 2) w Reflex to ID Panel     Status: None (Preliminary result)   Collection Time: 10/23/20  5:02 PM   Specimen: BLOOD LEFT HAND  Result Value Ref Range Status   Specimen Description BLOOD LEFT HAND  Final   Special Requests   Final    Blood Culture adequate volume BOTTLES DRAWN AEROBIC AND ANAEROBIC   Culture   Final    NO GROWTH 4 DAYS Performed at [facility], [address] [place], [place]    Report Status PENDING  Incomplete  Culture, blood (Routine X 2)  w Reflex to ID Panel     Status: None (Preliminary result)   Collection Time: 10/23/20  5:02 PM   Specimen: BLOOD RIGHT ARM  Result Value Ref Range Status   Specimen Description BLOOD RIGHT ARM  Final   Special Requests   Final    Blood Culture results may not be optimal due to an excessive volume of blood received in culture bottles BOTTLES DRAWN AEROBIC ONLY   Culture   Final    NO GROWTH 4 DAYS Performed at [facility], [address] [place], [place] [address]    Report Status PENDING  Incomplete  Resp Panel by RT-PCR (Flu A&B, Covid) Nasopharyngeal Swab     Status: None   Collection Time: 10/23/20  5:34 PM   Specimen: Nasopharyngeal Swab; Nasopharyngeal(NP) swabs in vial transport medium  Result Value Ref Range Status   SARS Coronavirus 2 by RT PCR NEGATIVE NEGATIVE Final    Comment: (NOTE) SARS-CoV-2 target nucleic acids are NOT DETECTED.  The SARS-CoV-2 RNA is generally detectable in upper respiratory specimens during the acute phase of infection. The lowest concentration of SARS-CoV-2 viral copies this assay can detect is 138 copies/mL. A negative result does not preclude SARS-Cov-2 infection and should not be used as the sole basis for treatment or other patient management decisions. A negative result may occur with  improper specimen collection/handling, submission of specimen other than nasopharyngeal swab, presence of viral mutation(s) within the areas  targeted by this assay, and inadequate number of viral copies(<138 copies/mL). A negative result must be combined with clinical observations, patient history, and epidemiological information. The expected result is Negative.  Fact Sheet for Patients:  BloggerCourse.com  Fact Sheet for Healthcare Providers:  SeriousBroker.it  This test is no t yet approved or cleared by the [place] FDA and  has been authorized for detection and/or diagnosis of SARS-CoV-2 by FDA under an Emergency Use Authorization (EUA). This EUA will remain  in effect (meaning this test can be used) for the duration of the COVID-19 declaration under Section 564(b)(1) of the Act, 21 U.S.C.section 360bbb-3(b)(1), unless the authorization is terminated  or revoked sooner.       Influenza A by PCR NEGATIVE NEGATIVE Final   Influenza B by PCR NEGATIVE NEGATIVE Final    Comment: (NOTE) The Xpert Xpress SARS-CoV-2/FLU/RSV plus assay is intended as an aid in the diagnosis of influenza from Nasopharyngeal swab specimens and should not be used as a sole basis for treatment. Nasal washings and aspirates are unacceptable for Xpert Xpress SARS-CoV-2/FLU/RSV testing.  Fact Sheet for Patients: BloggerCourse.com  Fact Sheet for Healthcare Providers: SeriousBroker.it  This test is not yet approved or cleared by the [place] FDA and has been authorized for detection and/or diagnosis of SARS-CoV-2 by FDA under an Emergency Use Authorization (EUA). This EUA will remain in effect (meaning this test can be used) for the duration of the COVID-19 declaration under Section 564(b)(1) of the Act, 21 U.S.C. section 360bbb-3(b)(1), unless the authorization is terminated or revoked.  Performed at [facility], [address] [place], [place] [address]   MRSA Next Gen by PCR, Nasal     Status: None   Collection Time: 10/24/20   9:38 AM   Specimen: Nasal Mucosa; Nasal Swab  Result Value Ref Range Status   MRSA by PCR Next Gen NOT DETECTED NOT DETECTED Final    Comment: (NOTE) The GeneXpert MRSA Assay (FDA approved for NASAL specimens only), is one component of a comprehensive MRSA colonization  surveillance program. It is not intended to diagnose MRSA infection nor to guide or monitor treatment for MRSA infections. Test performance is not FDA approved in patients less than 36 years old. Performed at [facility], [address] [place], [place] [address]       Studies: No results found.    Joycelyn Das, MD  [facility] 10/27/2020  If 7PM-7AM, please contact night-coverage

## 2020-10-27 NOTE — Progress Notes (Signed)
Patient ambulated in hallway with RN and NT. Patient made one lap around the unit. HR stayed between 100-120 Afib. Patient a little dyspneic on exertion but easily able to recover after resting.

## 2020-10-27 NOTE — Evaluation (Signed)
Physical Therapy Evaluation Patient Details Name: Erin Deleon MRN: [id number] DOB: [birth year] Today's Date: 10/27/2020   History of Present Illness  Erin Deleon  is a 45 y.o. female, with history of COPD, depression, hyperthyroidism, presented to the emergency department with complaints of tachycardia.  Patient was sent to the hospital by her PCP who noted that she had an abnormal EKG.  Patient denies any chest pain or palpitation.  She however had dyspnea for 2 weeks minimal exertion as well.  Patient was initially diagnosed with hyperthyroidism 11 years ago.   No history of thyroid storm, no history of being hospitalized for hyperthyroidism.  In the ED patient had tachycardia of 176 and was in atrial fibrillation with RVR.[name]  Patient received Decadron, Valium, Lopressor, propanolol, PTU given in the ED. patient was then admitted to the hospital for further evaluation and treatment.   Clinical Impression    Patient sitting up in chair and very agreeable to therapy. Able to walk around unit with modified independence. Patient occasionally used railing or desk as mild assist with walking but no loss of balance noted. Patient able to perform all transfers with modified independence and has boyfriend and son at home that can help her as needed. At this time patient is modified independence with all activities except for min assist with legs secondary to their increased weight from fluid overload, not weakness.  Patient discharged from physical therapy to nursing staff for ambulation as tolerated daily for length of stay.    Follow Up Recommendations Supervision - Intermittent;Outpatient PT    Equipment Recommendations  None recommended by PT    Recommendations for Other Services       Precautions / Restrictions Restrictions Weight Bearing Restrictions: No      Mobility  Bed Mobility Overal bed mobility: Needs Assistance             General bed mobility comments: needs assist with legs  as they are heavy with fluid with trnasition from supine to sit and sit to supine - otherwise modified independent    Transfers Overall transfer level: Modified independent Equipment used: None                Ambulation/Gait Ambulation/Gait assistance: Modified independent (Device/Increase time) Gait Distance (Feet): 100 Feet Assistive device: None Gait Pattern/deviations: Wide base of support Gait velocity: reduced   General Gait Details: pccassionally uses railing/ desk to place hand on for support. Slow gait but no loss of balance just reported genralized weakness  Stairs            Wheelchair Mobility    Modified Rankin (Stroke Patients Only)       Balance Overall balance assessment: Modified Independent                                           Pertinent Vitals/Pain Pain Assessment: No/denies pain    Home Living Family/patient expects to be discharged to:: Private residence Living Arrangements: Children;Spouse/significant other Available Help at Discharge: Family Type of Home: House Home Access: Stairs to enter Entrance Stairs-Rails: None (BF assists her with steps) Entrance Stairs-Number of Steps: 2-3 Home Layout: One level Home Equipment: None      Prior Function Level of Independence: Independent         Comments: able to cook and clean and take care of herself. Reports recent "slowing down" over the last couple  months but otherwise independent with no AD     Hand Dominance        Extremity/Trunk Assessment        Lower Extremity Assessment Lower Extremity Assessment: Generalized weakness       Communication   Communication: No difficulties  Cognition Arousal/Alertness: Awake/alert Behavior During Therapy: WFL for tasks assessed/performed                                          General Comments      Exercises     Assessment/Plan    PT Assessment All further PT needs can be met in  the next venue of care  PT Problem List Decreased strength;Decreased activity tolerance       PT Treatment Interventions      PT Goals (Current goals can be found in the Care Plan section)  Acute Rehab PT Goals Patient Stated Goal: to go home PT Goal Formulation: With patient Time For Goal Achievement: 11/10/20 Potential to Achieve Goals: Good    Frequency     Barriers to discharge        Co-evaluation               AM-PAC PT "6 Clicks" Mobility  Outcome Measure Help needed turning from your back to your side while in a flat bed without using bedrails?: None Help needed moving from lying on your back to sitting on the side of a flat bed without using bedrails?: None Help needed moving to and from a bed to a chair (including a wheelchair)?: None Help needed standing up from a chair using your arms (e.g., wheelchair or bedside chair)?: None Help needed to walk in hospital room?: None Help needed climbing 3-5 steps with a railing? : A Little 6 Click Score: 23    End of Session Equipment Utilized During Treatment: Gait belt Activity Tolerance: Patient tolerated treatment well;Patient limited by fatigue Patient left: in chair;with call bell/phone within reach Nurse Communication: Mobility status PT Visit Diagnosis: Unsteadiness on feet (R26.81);Muscle weakness (generalized) (M62.81)    Time: 2111-5520 PT Time Calculation (min) (ACUTE ONLY): 10 min   Charges:   PT Evaluation $PT Eval Low Complexity: 1 Low        11:24 AM, 10/27/20 Jerene Pitch, DPT Physical Therapy with [facility]  [phone] office

## 2020-10-28 LAB — CBC
HCT: 34.2 % — ABNORMAL LOW (ref 36.0–46.0)
Hemoglobin: 10.9 g/dL — ABNORMAL LOW (ref 12.0–15.0)
MCH: 26.2 pg (ref 26.0–34.0)
MCHC: 31.9 g/dL (ref 30.0–36.0)
MCV: 82.2 fL (ref 80.0–100.0)
Platelets: 173 10*3/uL (ref 150–400)
RBC: 4.16 MIL/uL (ref 3.87–5.11)
RDW: 16 % — ABNORMAL HIGH (ref 11.5–15.5)
WBC: 7.3 10*3/uL (ref 4.0–10.5)
nRBC: 0 % (ref 0.0–0.2)

## 2020-10-28 LAB — BASIC METABOLIC PANEL
Anion gap: 4 — ABNORMAL LOW (ref 5–15)
BUN: 12 mg/dL (ref 6–20)
CO2: 28 mmol/L (ref 22–32)
Calcium: 8.7 mg/dL — ABNORMAL LOW (ref 8.9–10.3)
Chloride: 107 mmol/L (ref 98–111)
Creatinine, Ser: 0.37 mg/dL — ABNORMAL LOW (ref 0.44–1.00)
GFR, Estimated: >60 mL/min (ref >60)
Glucose, Bld: 98 mg/dL (ref 70–99)
Potassium: 4.2 mmol/L (ref 3.5–5.1)
Sodium: 139 mmol/L (ref 135–145)

## 2020-10-28 LAB — GLUCOSE, CAPILLARY: Glucose-Capillary: 97 mg/dL (ref 70–99)

## 2020-10-28 LAB — MAGNESIUM: Magnesium: 2 mg/dL (ref 1.7–2.4)

## 2020-10-28 LAB — CULTURE, BLOOD (ROUTINE X 2)
Culture: NO GROWTH
Culture: NO GROWTH
Special Requests: ADEQUATE

## 2020-10-28 LAB — PHOSPHORUS: Phosphorus: 4.1 mg/dL (ref 2.5–4.6)

## 2020-10-28 NOTE — Progress Notes (Signed)
PROGRESS NOTE  Erin Deleon PIR:[id number] DOB: 15-Dec-1975 DOA: 10/23/2020 PCP: Heather Roberts, NP   LOS: 5 days   Brief narrative:  Erin Deleon  is a 44 y.o. female, with history of COPD, depression, hyperthyroidism, presented to the emergency department with complaints of tachycardia.  Patient was sent to the hospital by her PCP who noted that she had an abnormal EKG.  Patient denies any chest pain or palpitation.  She however had dyspnea for 2 weeks minimal exertion as well.  Patient was initially diagnosed with hyperthyroidism 11 years ago.   No history of thyroid storm, no history of being hospitalized for hyperthyroidism.  In the ED patient had tachycardia of 176 and was in atrial fibrillation with RVR.[name]  Patient received Decadron, Valium, Lopressor, propanolol, PTU given in the ED. patient was then admitted to the hospital for further evaluation and treatment.   Assessment/Plan:  Principal Problem:   Thyrotoxicosis Active Problems:   COPD (chronic obstructive pulmonary disease) (HCC)   Atrial fibrillation with RVR (HCC)   Acute CHF (congestive heart failure) (HCC)  Thyrotoxicosis Noncompliant to medications.  Patient received beta-blockers and  PTU in the ED.  Patient received Decadron and Valium.  Currently on  metamizole, beta-blockers .  Was on Cardizem drip due to atrial fibrillation which has been weaned off.  Will need to follow-up with endocrine as outpatient.  Patient does have history of Graves' disease.  Patient has not followed up with endocrine recently but has had follow-up in the past.TSH less than 0.010, free T4 greater than 5.50.  Patient was initially on propanolol which has been changed to atenolol.  Hypokalemia.  Improved after replacement.   A. fib with RVR Secondary to thyrotoxicosis.  On beta-blockers and was on Cardizem drip.  Not a good candidate for amiodarone.  Has received digoxin and Cardizem has been weaned off Cardizem due to reduced LV function.   Currently on daily digoxin and Eliquis.  Atenolol has been initiated.  Better control at this time  Acute systolic CHF Secondary to thyrotoxicosis.  2 D Echocardiogram with reduced LV function with EF of 40-45%.  Continue IV Lasix beta-blockers and digoxin.  Cardiology on board.     COPD Continue beta agonist.   Elevated bili Secondary to thyrotoxicosis  Morbid obesity.  Patient would benefit from weight loss as outpatient.  Debility, deconditioning.  Patient has been seen by physical therapy who recommended outpatient PT on discharge.  DVT prophylaxis: SCDs Start: 10/23/20 2114 apixaban (ELIQUIS) tablet 5 mg   Code Status: Full code  Family Communication: None  Status is: Inpatient  Remains inpatient appropriate because:IV treatments appropriate due to intensity of illness or inability to take PO and Inpatient level of care appropriate due to severity of illness  Dispo: The patient is from: Home              Anticipated d/c is to: Home likely tomorrow.  We will discuss with cardiology for further plans.              Patient currently is not medically stable to d/c.   Difficult to place patient No   Consultants: Cardiology  Procedures: None  Anti-infectives:  None  Anti-infectives (From admission, onward)    None      Subjective:  Today, patient was seen and examined at bedside.  Patient states that her fatigue and weakness has improved.  Has ambulated some.  Denies any chest pain dizziness lightheadedness.  Objective: Vitals:   10/28/20 1100 10/28/20  1117  BP: 140/73   Pulse: (!) 108 (!) 50  Resp: (!) 28 15  Temp:  98.6 F (37 C)  SpO2: 96% 96%    Intake/Output Summary (Last 24 hours) at 10/28/2020 1211 Last data filed at 10/28/2020 0900 Gross per 24 hour  Intake 240 ml  Output --  Net 240 ml    Filed Weights   10/26/20 0427 10/27/20 0346 10/28/20 0319  Weight: 124.3 kg 123.8 kg 121.2 kg   Body mass index is 41.85 kg/m.   Physical  Exam: General: Obese built, not in obvious distress HENT:   No scleral pallor or icterus noted. Oral mucosa is moist.  Chest: Decreased breath sounds bilaterally. CVS: S1 &S2 heard.  Irregularly irregular rhythm Abdomen: Soft, nontender, nondistended.  Bowel sounds are heard.   Extremities: No cyanosis, clubbing, with trace bilateral lower extremity edema.  Peripheral pulses are palpable. Psych: Alert, awake and oriented, normal mood CNS:  No cranial nerve deficits.  Power equal in all extremities.   Skin: Warm and dry.  No rashes noted.   Data Review: I have personally reviewed the following laboratory data and studies,  CBC: Recent Labs  Lab 10/23/20 1702 10/24/20 0539 10/25/20 0530 10/26/20 0306 10/27/20 0331 10/28/20 0358  WBC 11.5* 7.7 11.1* 8.6 7.4 7.3  NEUTROABS 5.9  --   --   --   --   --   HGB 12.1 11.9* 11.5* 10.8* 10.6* 10.9*  HCT 37.6 38.1 36.6 34.7* 33.4* 34.2*  MCV 81.7 82.8 82.1 83.8 81.9 82.2  PLT 200 180 178 142* 170 173    Basic Metabolic Panel: Recent Labs  Lab 10/24/20 0539 10/25/20 0530 10/26/20 1020 10/27/20 0331 10/28/20 0358  NA 137 136 136 138 139  K 4.5 3.6 3.3* 3.4* 4.2  CL 104 104 104 102 107  CO2 22 23 24 28 28   GLUCOSE 125* 112* 122* 89 98  BUN 15 22* 17 12 12   CREATININE 0.39* 0.41* 0.38* 0.34* 0.37*  CALCIUM 9.4 9.1 8.5* 8.4* 8.7*  MG  --  1.9  --  1.8 2.0  PHOS  --  3.3  --  4.4 4.1    Liver Function Tests: Recent Labs  Lab 10/23/20 1702 10/24/20 0539  AST 24 93*  ALT 20 45*  ALKPHOS 136* 129*  BILITOT 2.7* 3.3*  PROT 6.8 6.4*  ALBUMIN 3.4* 3.2*    No results for input(s): LIPASE, AMYLASE in the last 168 hours. No results for input(s): AMMONIA in the last 168 hours. Cardiac Enzymes: No results for input(s): CKTOTAL, CKMB, CKMBINDEX, TROPONINI in the last 168 hours. BNP (last 3 results) Recent Labs    10/23/20 1702 10/27/20 0331  BNP 632.0* 1,004.0*     ProBNP (last 3 results) No results for input(s): PROBNP  in the last 8760 hours.  CBG: Recent Labs  Lab 10/24/20 0856 10/25/20 0732 10/26/20 0730 10/27/20 0749 10/28/20 0711  GLUCAP 119* 100* 77 72 97    Recent Results (from the past 240 hour(s))  Culture, blood (Routine X 2) w Reflex to ID Panel     Status: None   Collection Time: 10/23/20  5:02 PM   Specimen: BLOOD LEFT HAND  Result Value Ref Range Status   Specimen Description BLOOD LEFT HAND  Final   Special Requests   Final    Blood Culture adequate volume BOTTLES DRAWN AEROBIC AND ANAEROBIC   Culture   Final    NO GROWTH 5 DAYS Performed at [facility], [address] [place], [place] [address]    Report Status 10/28/2020 FINAL  Final  Culture, blood (Routine X 2) w Reflex to ID Panel     Status: None   Collection Time: 10/23/20  5:02 PM   Specimen: BLOOD RIGHT ARM  Result Value Ref Range Status   Specimen Description BLOOD RIGHT ARM  Final   Special Requests   Final    Blood Culture results may not be optimal due to an excessive volume of blood received in culture bottles BOTTLES DRAWN AEROBIC ONLY   Culture   Final    NO GROWTH 5 DAYS Performed at [facility], [address] [place], [place] [address]    Report Status 10/28/2020 FINAL  Final  Resp Panel by RT-PCR (Flu A&B, Covid) Nasopharyngeal Swab     Status: None   Collection Time: 10/23/20  5:34 PM   Specimen: Nasopharyngeal Swab; Nasopharyngeal(NP) swabs in vial transport medium  Result Value Ref Range Status   SARS Coronavirus 2 by RT PCR NEGATIVE NEGATIVE Final    Comment: (NOTE) SARS-CoV-2 target nucleic acids are NOT DETECTED.  The SARS-CoV-2 RNA is generally detectable in upper respiratory specimens during the acute phase of infection. The lowest concentration of SARS-CoV-2 viral copies this assay can detect is 138 copies/mL. A negative result does not preclude SARS-Cov-2 infection and should not be used as the sole basis for treatment or other patient management decisions. A negative result may occur  with  improper specimen collection/handling, submission of specimen other than nasopharyngeal swab, presence of viral mutation(s) within the areas targeted by this assay, and inadequate number of viral copies(<138 copies/mL). A negative result must be combined with clinical observations, patient history, and epidemiological information. The expected result is Negative.  Fact Sheet for Patients:  BloggerCourse.com  Fact Sheet for Healthcare Providers:  SeriousBroker.it  This test is no t yet approved or cleared by the [place] FDA and  has been authorized for detection and/or diagnosis of SARS-CoV-2 by FDA under an Emergency Use Authorization (EUA). This EUA will remain  in effect (meaning this test can be used) for the duration of the COVID-19 declaration under Section 564(b)(1) of the Act, 21 U.S.C.section 360bbb-3(b)(1), unless the authorization is terminated  or revoked sooner.       Influenza A by PCR NEGATIVE NEGATIVE Final   Influenza B by PCR NEGATIVE NEGATIVE Final    Comment: (NOTE) The Xpert Xpress SARS-CoV-2/FLU/RSV plus assay is intended as an aid in the diagnosis of influenza from Nasopharyngeal swab specimens and should not be used as a sole basis for treatment. Nasal washings and aspirates are unacceptable for Xpert Xpress SARS-CoV-2/FLU/RSV testing.  Fact Sheet for Patients: BloggerCourse.com  Fact Sheet for Healthcare Providers: SeriousBroker.it  This test is not yet approved or cleared by the [place] FDA and has been authorized for detection and/or diagnosis of SARS-CoV-2 by FDA under an Emergency Use Authorization (EUA). This EUA will remain in effect (meaning this test can be used) for the duration of the COVID-19 declaration under Section 564(b)(1) of the Act, 21 U.S.C. section 360bbb-3(b)(1), unless the authorization is terminated  or revoked.  Performed at [facility], [address] [place], [place] [address]   MRSA Next Gen by PCR, Nasal     Status: None   Collection Time: 10/24/20  9:38 AM   Specimen: Nasal Mucosa; Nasal Swab  Result Value Ref Range Status   MRSA by PCR Next Gen NOT DETECTED NOT DETECTED Final    Comment: (  NOTE) The GeneXpert MRSA Assay (FDA approved for NASAL specimens only), is one component of a comprehensive MRSA colonization surveillance program. It is not intended to diagnose MRSA infection nor to guide or monitor treatment for MRSA infections. Test performance is not FDA approved in patients less than 89 years old. Performed at [facility], [address] [place], [place] [address]       Studies: No results found.    Joycelyn Das, MD  [facility] 10/28/2020  If 7PM-7AM, please contact night-coverage

## 2020-10-29 DIAGNOSIS — I4819 Other persistent atrial fibrillation: Secondary | ICD-10-CM

## 2020-10-29 DIAGNOSIS — E0591 Thyrotoxicosis, unspecified with thyrotoxic crisis or storm: Secondary | ICD-10-CM

## 2020-10-29 LAB — CBC
HCT: 33.5 % — ABNORMAL LOW (ref 36.0–46.0)
Hemoglobin: 10.6 g/dL — ABNORMAL LOW (ref 12.0–15.0)
MCH: 25.9 pg — ABNORMAL LOW (ref 26.0–34.0)
MCHC: 31.6 g/dL (ref 30.0–36.0)
MCV: 81.7 fL (ref 80.0–100.0)
Platelets: 162 10*3/uL (ref 150–400)
RBC: 4.1 MIL/uL (ref 3.87–5.11)
RDW: 15.9 % — ABNORMAL HIGH (ref 11.5–15.5)
WBC: 7.9 10*3/uL (ref 4.0–10.5)
nRBC: 0 % (ref 0.0–0.2)

## 2020-10-29 LAB — BASIC METABOLIC PANEL
Anion gap: 6 (ref 5–15)
BUN: 9 mg/dL (ref 6–20)
CO2: 27 mmol/L (ref 22–32)
Calcium: 9.1 mg/dL (ref 8.9–10.3)
Chloride: 106 mmol/L (ref 98–111)
Creatinine, Ser: 0.35 mg/dL — ABNORMAL LOW (ref 0.44–1.00)
GFR, Estimated: >60 mL/min (ref >60)
Glucose, Bld: 93 mg/dL (ref 70–99)
Potassium: 4.2 mmol/L (ref 3.5–5.1)
Sodium: 139 mmol/L (ref 135–145)

## 2020-10-29 LAB — TSH: TSH: <0.01 u[IU]/mL — ABNORMAL LOW (ref 0.350–4.500)

## 2020-10-29 LAB — GLUCOSE, CAPILLARY: Glucose-Capillary: 88 mg/dL (ref 70–99)

## 2020-10-29 MED ORDER — POTASSIUM CHLORIDE CRYS ER 20 MEQ PO TBCR: 20.0000 meq | EXTENDED_RELEASE_TABLET | Freq: Every day | ORAL | 2 refills | Status: DC

## 2020-10-29 MED ORDER — ATENOLOL 25 MG PO TABS: 25.0000 mg | ORAL_TABLET | Freq: Two times a day (BID) | ORAL | 2 refills | Status: DC

## 2020-10-29 MED ORDER — METHIMAZOLE 10 MG PO TABS: 20.0000 mg | ORAL_TABLET | Freq: Every day | ORAL | 2 refills | Status: DC

## 2020-10-29 MED ORDER — APIXABAN 5 MG PO TABS: 5.0000 mg | ORAL_TABLET | Freq: Two times a day (BID) | ORAL | 3 refills | Status: DC

## 2020-10-29 MED ORDER — ATENOLOL 25 MG PO TABS
25.0000 mg | ORAL_TABLET | Freq: Two times a day (BID) | ORAL | Status: DC
  Administered 2020-10-29: 25 mg via ORAL

## 2020-10-29 MED ORDER — FUROSEMIDE 40 MG PO TABS: 40.0000 mg | ORAL_TABLET | Freq: Every day | ORAL | 11 refills | Status: AC

## 2020-10-29 MED ORDER — DIGOXIN 250 MCG PO TABS: 0.2500 mg | ORAL_TABLET | Freq: Every day | ORAL | 2 refills | Status: DC

## 2020-10-29 MED ORDER — MAGNESIUM OXIDE -MG SUPPLEMENT 400 (240 MG) MG PO TABS
400.0000 mg | ORAL_TABLET | Freq: Every day | ORAL | 0 refills | Status: DC
Start: 2020-10-29 — End: 2020-11-30

## 2020-10-29 NOTE — Progress Notes (Signed)
Nsg Discharge Note  Admit Date:  10/23/2020 Discharge date: 10/29/2020   Baruch Merl to be D/C'd Home per MD order.  AVS completed.  Copy for chart, and copy for patient signed, and dated. Patient/caregiver able to verbalize understanding. IV removed, discharge paper work given and reviewed with patient, patient stable upon discharge.   Discharge Medication: Allergies as of 10/29/2020   No Known Allergies      Medication List     TAKE these medications    apixaban 5 MG Tabs tablet Commonly known as: ELIQUIS Take 1 tablet (5 mg total) by mouth 2 (two) times daily.   atenolol 25 MG tablet Commonly known as: TENORMIN Take 1 tablet (25 mg total) by mouth 2 (two) times daily.   digoxin 0.25 MG tablet Commonly known as: LANOXIN Take 1 tablet (0.25 mg total) by mouth daily.   furosemide 40 MG tablet Commonly known as: Lasix Take 1 tablet (40 mg total) by mouth daily after breakfast.   magnesium oxide 400 (240 Mg) MG tablet Commonly known as: MAG-OX Take 1 tablet (400 mg total) by mouth daily.   methimazole 10 MG tablet Commonly known as: TAPAZOLE Take 2 tablets (20 mg total) by mouth daily.   potassium chloride SA 20 MEQ tablet Commonly known as: KLOR-CON Take 1 tablet (20 mEq total) by mouth daily.        Discharge Assessment: Vitals:   10/29/20 0538 10/29/20 0921  BP: 108/71 137/84  Pulse: 99 64  Resp: 17 18  Temp: 97.9 F (36.6 C)   SpO2: 96% 97%   Skin clean, dry and intact without evidence of skin break down, no evidence of skin tears noted. IV catheter discontinued intact. Site without signs and symptoms of complications - no redness or edema noted at insertion site, patient denies c/o pain - only slight tenderness at site.  Dressing with slight pressure applied.  D/c Instructions-Education: Discharge instructions given to patient/family with verbalized understanding. D/c education completed with patient/family including follow up instructions, medication  list, d/c activities limitations if indicated, with other d/c instructions as indicated by MD - patient able to verbalize understanding, all questions fully answered. Patient instructed to return to ED, call 911, or call MD for any changes in condition.  Patient escorted via WC, and D/C home via private auto.  Lonn Georgia, RN 10/29/2020 10:38 AM

## 2020-10-29 NOTE — TOC Transition Note (Signed)
Transition of Care [facility]) - CM/SW Discharge Note   Patient Details  Name: Erin Deleon MRN: [id number] Date of Birth: 12-28-1975  Transition of Care [facility]) CM/SW Contact:  Villa Herb, LCSWA Phone Number: 10/29/2020, 9:45 AM   Clinical Narrative:    CSW spoke to pt to inquire about interest in outpatient PT as it was recommended by PT. Pt states that she is interested in the referral being made. CSW made referral for pt. CSW also inquired about pts interest in a referral to the financial counselor due to pts no insurance status. Pt is interested and referral was made. TOC signing off.   Final next level of care: OP Rehab Barriers to Discharge: Barriers Resolved   Patient Goals and CMS Choice Patient states their goals for this hospitalization and ongoing recovery are:: Return home CMS Medicare.gov Compare Post Acute Care list provided to:: Patient Choice offered to / list presented to : Patient  Discharge Placement                       Discharge Plan and Services                                     Social Determinants of Health (SDOH) Interventions     Readmission Risk Interventions No flowsheet data found.

## 2020-10-29 NOTE — Progress Notes (Addendum)
Progress Note  Patient Name: Erin Deleon Date of Encounter: 10/29/2020  [facility] Cardiologist: Dina Rich, MD    Subjective   Occasionally feels a palpitation but says she's had all her life. Wants to go home.  Inpatient Medications    Scheduled Meds:  apixaban  5 mg Oral BID   atenolol  25 mg Oral BID   Chlorhexidine Gluconate Cloth  6 each Topical Daily   digoxin  0.25 mg Oral Daily   furosemide  40 mg Intravenous Daily   magnesium oxide  400 mg Oral BID   methimazole  20 mg Oral Daily   Continuous Infusions:  methocarbamol (ROBAXIN) IV     PRN Meds: acetaminophen **OR** acetaminophen, albuterol, methocarbamol (ROBAXIN) IV, ondansetron **OR** ondansetron (ZOFRAN) IV, oxyCODONE   Vital Signs    Vitals:   10/29/20 0200 10/29/20 0210 10/29/20 0258 10/29/20 0538  BP:  128/64 (!) 132/93 108/71  Pulse: (!) 104  (!) 49 99  Resp: (!) 29 (!) 24 18 17   Temp:   98 F (36.7 C) 97.9 F (36.6 C)  TempSrc:   Oral Oral  SpO2: 98%  99% 96%  Weight:    120.1 kg  Height:        Intake/Output Summary (Last 24 hours) at 10/29/2020 0913 Last data filed at 10/28/2020 2000 Gross per 24 hour  Intake 240 ml  Output --  Net 240 ml   Last 3 Weights 10/29/2020 10/28/2020 10/27/2020  Weight (lbs) 264 lb 12.4 oz 267 lb 3.2 oz 272 lb 14.9 oz  Weight (kg) 120.1 kg 121.2 kg 123.8 kg      Telemetry    Afib 100-120/m - Personally Reviewed  ECG    No new tracing reviewed today.  Physical Exam    GEN: No acute distress.   Neck: No JVD Cardiac: irreg, no murmurs, rubs, or gallops.  Respiratory: Clear to auscultation bilaterally. GI: Soft, nontender, non-distended  MS: plus 1 edema; No deformity. Neuro:  Nonfocal  Psych: Normal affect   Labs    Chemistry Recent Labs  Lab 10/23/20 1702 10/24/20 0539 10/25/20 0530 10/27/20 0331 10/28/20 0358 10/29/20 0610  NA 135 137   < > 138 139 139  K 3.7 4.5   < > 3.4* 4.2 4.2  CL 105 104   < > 102 107 106  CO2 20* 22   < >  28 28 27   GLUCOSE 108* 125*   < > 89 98 93  BUN 10 15   < > 12 12 9   CREATININE 0.40* 0.39*   < > 0.34* 0.37* 0.35*  CALCIUM 8.9 9.4   < > 8.4* 8.7* 9.1  PROT 6.8 6.4*  --   --   --   --   ALBUMIN 3.4* 3.2*  --   --   --   --   AST 24 93*  --   --   --   --   ALT 20 45*  --   --   --   --   ALKPHOS 136* 129*  --   --   --   --   BILITOT 2.7* 3.3*  --   --   --   --   GFRNONAA >60 >60   < > >60 >60 >60  ANIONGAP 10 11   < > 8 4* 6   < > = values in this interval not displayed.     Hematology Recent Labs  Lab 10/27/20 [phone] 10/28/20 0358 10/29/20  0610  WBC 7.4 7.3 7.9  RBC 4.08 4.16 4.10  HGB 10.6* 10.9* 10.6*  HCT 33.4* 34.2* 33.5*  MCV 81.9 82.2 81.7  MCH 26.0 26.2 25.9*  MCHC 31.7 31.9 31.6  RDW 15.9* 16.0* 15.9*  PLT 170 173 162    BNP Recent Labs  Lab 10/23/20 1702 10/27/20 0331  BNP 632.0* 1,004.0*     Radiology    No results found.  Cardiac Studies    Echo 09/2026 IMPRESSIONS    1. Somewhat limited visualization of LV endocardium, grossly LVEF appears  around 40-45%. Would recommend limited contrast study once heart rates are  better controlled. . Left ventricular ejection fraction, by estimation, is  40 to 45%. The left ventricle   has mildly decreased function. The left ventricle demonstrates global  hypokinesis. Left ventricular diastolic parameters are indeterminate.   2. Right ventricular systolic function is low normal. The right  ventricular size is moderately enlarged. Tricuspid regurgitation signal is  inadequate for assessing PA pressure.   3. Left atrial size was mildly dilated.   4. Right atrial size was mildly dilated.   5. A small pericardial effusion is present. The pericardial effusion is  circumferential.   6. The mitral valve is normal in structure. Mild mitral valve  regurgitation. No evidence of mitral stenosis.   7. The tricuspid valve is abnormal. Tricuspid valve regurgitation is mild  to moderate.   8. The aortic valve has  an indeterminant number of cusps. Aortic valve  regurgitation is not visualized. No aortic stenosis is present.   9. The inferior vena cava is dilated in size with <50% respiratory  variability, suggesting right atrial pressure of 15 mmHg.   Patient Profile     45 y.o. female  with a hx of COPD, depression, hyperthyroidism and now admitted with atrial fib who is being seen 10/25/2020 for the evaluation of atrial fib with RVR at the request of Dr. Tyson Babinski.  Assessment & Plan    Afib with RVR in the setting of severe hyperthyroidism, loaded with Dig because of soft BP, atenolol 12.5 mg bid, CHADS2Vasc=2 on Eliquis. HR 100-120/m BP better this am, could try atenolol 25 mg AM and 12.5 mg PM. Will make outpatient f/u in 3-4 weeks.  Acute systolic  CHF, EF 40-45% but will need a repeat when rates better controlled. still with some leg edema on IV lasix 40 mg once daily. Can send home on 40 mg po daily with Kdur 20 meq daily. Needs outpatient sleep study with RV enlargement, HF, AFib, body habitus.  Hyperthyroidism-TSH< 0.010 on Tapazole   [facility] will sign off.   Medication Recommendations:  see above Other recommendations (labs, testing, etc):  outpatient sleep study Follow up as an outpatient:  3-4 weeks, will arrange  For questions or updates, please contact [facility] Please consult [email or web address] for contact info under     Signed, Jacolyn Reedy, PA-C 10/29/2020, 9:13 AM     Attending note:  Chart reviewed and case discussed with Ms. Lilla Shook, I agree with her above recommendations.  Patient anticipates discharge home today.  Plan is to continue heart rate control strategy with atrial fibrillation, particularly until thyroid status is stabilized.  Cardioversion would only be considered at that time.  For home regimen would continue Eliquis for stroke prophylaxis, change atenolol to 25 mg the morning and 12.5 mg in the evening (with further up titration if necessary as an  outpatient), continue digoxin, and change to Lasix 40 mg daily  with KCl 20 mEq daily.  Will arrange office follow-up in the next 3 weeks and she will need a repeat be made around that time.  Jonelle Sidle, M.D., F.A.C.C.

## 2020-10-29 NOTE — Discharge Summary (Signed)
Physician Discharge Summary  Erin Deleon POL:[id number] DOB: 07-07-1975 DOA: 10/23/2020  PCP: Heather Roberts, NP  Admit date: 10/23/2020 Discharge date: 10/29/2020  Admitted From: Home  Discharge disposition: Home  Recommendations for Outpatient Follow-Up:   Follow up with your primary care provider in one week.  Patient will need endocrinology referral.  Please monitor T3-T4 and TSH in the next visit. Patient had heart failure secondary to thyrotoxicosis and will need cardiology follow-up.  Cardiology office to follow-up with the patient in 3 to 4 weeks. Check CBC, BMP, magnesium in the next visit   Discharge Diagnosis:   Principal Problem:   Thyrotoxicosis Active Problems:   COPD (chronic obstructive pulmonary disease) (HCC)   Atrial fibrillation with RVR (HCC)   Acute CHF (congestive heart failure) (HCC)   Discharge Condition: Improved.  Diet recommendation: Low sodium, heart healthy.    Wound care: None.  Code status: Full.   History of Present Illness:   Erin Deleon  is a 45 y.o. female, with history of COPD, depression, hyperthyroidism, presented to the emergency department with complaints of tachycardia.  Patient was sent to the hospital by her PCP who noted that she had an abnormal EKG.  Patient denies any chest pain or palpitation.  She however had dyspnea for 2 weeks minimal exertion as well.  Patient was initially diagnosed with hyperthyroidism 11 years ago.   No history of thyroid storm, no history of being hospitalized for hyperthyroidism.  In the ED patient had tachycardia of 176 and was in atrial fibrillation with RVR.[name]  Patient received Decadron, Valium, Lopressor, propanolol, PTU given in the ED. patient was then admitted to the hospital for further evaluation and treatment.   Hospital Course:   Following conditions were addressed during hospitalization as listed below,  Thyrotoxicosis Secondary to noncompliant to medications.  Patient received  beta-blockers and  PTU in the ED on initial presentation including Decadron and Valium.  Subsequently patient was transitioned to methimazole, beta-blockers .  Was on Cardizem drip initially due to atrial fibrillation with RVR but was discontinued after LV function was noted to be low.  Will need to follow-up with endocrine as outpatient.  Patient does have history of Graves' disease.  Patient has not followed up with endocrine recently but has had follow-up in the past.TSH less than 0.010 on presentation., free T4 greater than 5.50.  Patient was initially on propanolol which has been changed to atenolol.  At this time, atenolol dose has been adjusted to 25 mg twice a day.  This will need to be monitored as outpatient.   Hypokalemia.  Improved after replacement.  Patient will be given potassium supplement on discharge since she will also be on diuretics.   A. fib with RVR Secondary to thyrotoxicosis.  Not a good candidate for amiodarone.  Has received digoxin due to reduced LV function.  Currently on daily digoxin and Eliquis.  Atenolol has been introduced with improved overall heart rate.  This will need to be adjusted as outpatient.  Follow-up with endocrinology and cardiology as outpatient.   Acute systolic CHF Secondary to thyrotoxicosis.  2 D Echocardiogram with reduced LV function with EF of 40-45%.  Received IV Lasix during hospitalization.  Patient has been started on atenolol digoxin.  Cardiology followed patient during hospitalization.  Will need to follow-up with cardiology as outpatient.  Patient will be given Lasix low-salt diet instructions on discharge.   COPD Compensated.  Might need inhalers at some point.   Elevated bili Secondary to  thyrotoxicosis.  Please consider repeating LFTs in the next visit.   Morbid obesity.  Patient would benefit from weight loss as outpatient.   Debility, deconditioning.  Patient has been seen by physical therapy who recommended outpatient PT on  discharge.  Disposition.  At this time, patient is stable for disposition home with outpatient PCP, cardiology and endocrinology follow-up. Medical Consultants:   Cardiology  Procedures:    2D echocardiogram Subjective:   Today, patient was seen and examined at bedside.  Denies any dizziness, lightheadedness, shortness of breath, congestion.  States that her energy has improved  Discharge Exam:   Vitals:   10/29/20 0258 10/29/20 0538  BP: (!) 132/93 108/71  Pulse: (!) 49 99  Resp: 18 17  Temp: 98 F (36.7 C) 97.9 F (36.6 C)  SpO2: 99% 96%   Vitals:   10/29/20 0200 10/29/20 0210 10/29/20 0258 10/29/20 0538  BP:  128/64 (!) 132/93 108/71  Pulse: (!) 104  (!) 49 99  Resp: (!) 29 (!) 24 18 17   Temp:   98 F (36.7 C) 97.9 F (36.6 C)  TempSrc:   Oral Oral  SpO2: 98%  99% 96%  Weight:    120.1 kg  Height:       General: Alert awake, not in obvious distress, obese built HENT: pupils equally reacting to light,  No scleral pallor or icterus noted. Oral mucosa is moist.  Chest:  Clear breath sounds.  Diminished breath sounds bilaterally. No crackles or wheezes.  CVS: S1 &S2 heard, irregular rhythm Abdomen: Soft, nontender, nondistended.  Bowel sounds are heard.   Extremities: No cyanosis, clubbing with trace pedal edema, peripheral pulses are palpable. Psych: Alert, awake and oriented, normal mood CNS:  No cranial nerve deficits.  Power equal in all extremities.   Skin: Warm and dry.  No rashes noted.  The results of significant diagnostics from this hospitalization (including imaging, microbiology, ancillary and laboratory) are listed below for reference.     Diagnostic Studies:   Portable chest 1 View  Result Date: 10/24/2020 CLINICAL DATA:  Pulmonary vascular congestion EXAM: PORTABLE CHEST 1 VIEW COMPARISON:  10/23/2020 FINDINGS: Mild bilateral interstitial thickening. No focal consolidation. No pleural effusion or pneumothorax. Stable cardiomegaly. No acute  osseous abnormality. IMPRESSION: Cardiomegaly with mild pulmonary vascular congestion. Electronically Signed   By: 10/25/2020   On: 10/24/2020 06:26   DG Chest Port 1 View  Result Date: 10/23/2020 CLINICAL DATA:  45 year old female with shortness of breath and palpitation. EXAM: PORTABLE CHEST 1 VIEW COMPARISON:  Chest radiograph dated 08/15/2020. FINDINGS: Evaluation is limited due to overlying support wires and pads. There is cardiomegaly with mild vascular congestion. Probable trace bilateral pleural effusions. No focal consolidation or pneumothorax. No acute osseous pathology. IMPRESSION: Cardiomegaly with mild vascular congestion. No focal consolidation. Electronically Signed   By: 08/17/2020 M.D.   On: 10/23/2020 16:38     Labs:   Basic Metabolic Panel: Recent Labs  Lab 10/25/20 0530 10/26/20 1020 10/27/20 0331 10/28/20 0358 10/29/20 0610  NA 136 136 138 139 139  K 3.6 3.3* 3.4* 4.2 4.2  CL 104 104 102 107 106  CO2 23 24 28 28 27   GLUCOSE 112* 122* 89 98 93  BUN 22* 17 12 12 9   CREATININE 0.41* 0.38* 0.34* 0.37* 0.35*  CALCIUM 9.1 8.5* 8.4* 8.7* 9.1  MG 1.9  --  1.8 2.0  --   PHOS 3.3  --  4.4 4.1  --    GFR Estimated Creatinine  Clearance: 119.2 mL/min (A) (by C-G formula based on SCr of 0.35 mg/dL (L)). Liver Function Tests: Recent Labs  Lab 10/23/20 1702 10/24/20 0539  AST 24 93*  ALT 20 45*  ALKPHOS 136* 129*  BILITOT 2.7* 3.3*  PROT 6.8 6.4*  ALBUMIN 3.4* 3.2*   No results for input(s): LIPASE, AMYLASE in the last 168 hours. No results for input(s): AMMONIA in the last 168 hours. Coagulation profile Recent Labs  Lab 10/23/20 1707  INR 1.3*    CBC: Recent Labs  Lab 10/23/20 1702 10/24/20 0539 10/25/20 0530 10/26/20 0306 10/27/20 0331 10/28/20 0358 10/29/20 0610  WBC 11.5*   < > 11.1* 8.6 7.4 7.3 7.9  NEUTROABS 5.9  --   --   --   --   --   --   HGB 12.1   < > 11.5* 10.8* 10.6* 10.9* 10.6*  HCT 37.6   < > 36.6 34.7* 33.4* 34.2* 33.5*   MCV 81.7   < > 82.1 83.8 81.9 82.2 81.7  PLT 200   < > 178 142* 170 173 162   < > = values in this interval not displayed.   Cardiac Enzymes: No results for input(s): CKTOTAL, CKMB, CKMBINDEX, TROPONINI in the last 168 hours. BNP: Invalid input(s): POCBNP CBG: Recent Labs  Lab 10/25/20 0732 10/26/20 0730 10/27/20 0749 10/28/20 0711 10/29/20 0750  GLUCAP 100* 77 72 97 88   D-Dimer No results for input(s): DDIMER in the last 72 hours. Hgb A1c No results for input(s): HGBA1C in the last 72 hours. Lipid Profile No results for input(s): CHOL, HDL, LDLCALC, TRIG, CHOLHDL, LDLDIRECT in the last 72 hours. Thyroid function studies Recent Labs    10/27/20 0334  TSH <0.010*   Anemia work up No results for input(s): VITAMINB12, FOLATE, FERRITIN, TIBC, IRON, RETICCTPCT in the last 72 hours. Microbiology Recent Results (from the past 240 hour(s))  Culture, blood (Routine X 2) w Reflex to ID Panel     Status: None   Collection Time: 10/23/20  5:02 PM   Specimen: BLOOD LEFT HAND  Result Value Ref Range Status   Specimen Description BLOOD LEFT HAND  Final   Special Requests   Final    Blood Culture adequate volume BOTTLES DRAWN AEROBIC AND ANAEROBIC   Culture   Final    NO GROWTH 5 DAYS Performed at [facility], [address], [place], [place] [address]    Report Status 10/28/2020 FINAL  Final  Culture, blood (Routine X 2) w Reflex to ID Panel     Status: None   Collection Time: 10/23/20  5:02 PM   Specimen: BLOOD RIGHT ARM  Result Value Ref Range Status   Specimen Description BLOOD RIGHT ARM  Final   Special Requests   Final    Blood Culture results may not be optimal due to an excessive volume of blood received in culture bottles BOTTLES DRAWN AEROBIC ONLY   Culture   Final    NO GROWTH 5 DAYS Performed at [facility], [address] [place], [place] [address]    Report Status 10/28/2020 FINAL  Final  Resp Panel by RT-PCR (Flu A&B, Covid) Nasopharyngeal Swab     Status:  None   Collection Time: 10/23/20  5:34 PM   Specimen: Nasopharyngeal Swab; Nasopharyngeal(NP) swabs in vial transport medium  Result Value Ref Range Status   SARS Coronavirus 2 by RT PCR NEGATIVE NEGATIVE Final    Comment: (NOTE) SARS-CoV-2 target nucleic acids are NOT DETECTED.  The  SARS-CoV-2 RNA is generally detectable in upper respiratory specimens during the acute phase of infection. The lowest concentration of SARS-CoV-2 viral copies this assay can detect is 138 copies/mL. A negative result does not preclude SARS-Cov-2 infection and should not be used as the sole basis for treatment or other patient management decisions. A negative result may occur with  improper specimen collection/handling, submission of specimen other than nasopharyngeal swab, presence of viral mutation(s) within the areas targeted by this assay, and inadequate number of viral copies(<138 copies/mL). A negative result must be combined with clinical observations, patient history, and epidemiological information. The expected result is Negative.  Fact Sheet for Patients:  BloggerCourse.com  Fact Sheet for Healthcare Providers:  SeriousBroker.it  This test is no t yet approved or cleared by the [place] FDA and  has been authorized for detection and/or diagnosis of SARS-CoV-2 by FDA under an Emergency Use Authorization (EUA). This EUA will remain  in effect (meaning this test can be used) for the duration of the COVID-19 declaration under Section 564(b)(1) of the Act, 21 U.S.C.section 360bbb-3(b)(1), unless the authorization is terminated  or revoked sooner.       Influenza A by PCR NEGATIVE NEGATIVE Final   Influenza B by PCR NEGATIVE NEGATIVE Final    Comment: (NOTE) The Xpert Xpress SARS-CoV-2/FLU/RSV plus assay is intended as an aid in the diagnosis of influenza from Nasopharyngeal swab specimens and should not be used as a sole basis for  treatment. Nasal washings and aspirates are unacceptable for Xpert Xpress SARS-CoV-2/FLU/RSV testing.  Fact Sheet for Patients: BloggerCourse.com  Fact Sheet for Healthcare Providers: SeriousBroker.it  This test is not yet approved or cleared by the [place] FDA and has been authorized for detection and/or diagnosis of SARS-CoV-2 by FDA under an Emergency Use Authorization (EUA). This EUA will remain in effect (meaning this test can be used) for the duration of the COVID-19 declaration under Section 564(b)(1) of the Act, 21 U.S.C. section 360bbb-3(b)(1), unless the authorization is terminated or revoked.  Performed at [facility], [address] [place], [place] [address]   MRSA Next Gen by PCR, Nasal     Status: None   Collection Time: 10/24/20  9:38 AM   Specimen: Nasal Mucosa; Nasal Swab  Result Value Ref Range Status   MRSA by PCR Next Gen NOT DETECTED NOT DETECTED Final    Comment: (NOTE) The GeneXpert MRSA Assay (FDA approved for NASAL specimens only), is one component of a comprehensive MRSA colonization surveillance program. It is not intended to diagnose MRSA infection nor to guide or monitor treatment for MRSA infections. Test performance is not FDA approved in patients less than 50 years old. Performed at [facility], [address] [place], [place] [address]      Discharge Instructions:   Discharge Instructions     (HEART FAILURE PATIENTS) Call MD:  Anytime you have any of the following symptoms: 1) 3 pound weight gain in 24 hours or 5 pounds in 1 week 2) shortness of breath, with or without a dry hacking cough 3) swelling in the hands, feet or stomach 4) if you have to sleep on extra pillows at night in order to breathe.   Complete by: As directed    Avoid straining   Complete by: As directed    Diet - low sodium heart healthy   Complete by: As directed    Discharge instructions   Complete by: As  directed    Please follow-up with your primary care  physician in 1 week.  Discuss about endocrinology referral.  Please follow-up with cardiology in 3 to 4 weeks (office to schedule).  Continue to take your medications as prescribed.  [facility] in 1-2 weeks to adjust your thyroid medication if able to get appointment.  Take precautions while you are on blood thinners.  Seek medical attention for worsening symptoms.  Low-salt diet.   Heart Failure patients record your daily weight using the same scale at the same time of day   Complete by: As directed    Increase activity slowly   Complete by: As directed    STOP any activity that causes chest pain, shortness of breath, dizziness, sweating, or exessive weakness   Complete by: As directed       Allergies as of 10/29/2020   No Known Allergies      Medication List     TAKE these medications    apixaban 5 MG Tabs tablet Commonly known as: ELIQUIS Take 1 tablet (5 mg total) by mouth 2 (two) times daily.   atenolol 25 MG tablet Commonly known as: TENORMIN Take 1 tablet (25 mg total) by mouth 2 (two) times daily.   digoxin 0.25 MG tablet Commonly known as: LANOXIN Take 1 tablet (0.25 mg total) by mouth daily.   furosemide 40 MG tablet Commonly known as: Lasix Take 1 tablet (40 mg total) by mouth daily after breakfast.   magnesium oxide 400 (240 Mg) MG tablet Commonly known as: MAG-OX Take 1 tablet (400 mg total) by mouth daily.   methimazole 10 MG tablet Commonly known as: TAPAZOLE Take 2 tablets (20 mg total) by mouth daily.   potassium chloride SA 20 MEQ tablet Commonly known as: KLOR-CON Take 1 tablet (20 mEq total) by mouth daily.        Follow-up Information     Heather RobertsGray, Joseph M, NP. Schedule an appointment as soon as possible for a visit in 1 week(s).   Specialty: Nurse Practitioner Why: will need endocrinology referral, thyroid function followup, medication  adjustment Contact information: [address]  [address] [place] [address] [phone]         [facility]. Schedule an appointment as soon as possible for a visit in 1 week(s).   Why: for thyroid problem Contact information: [address] [place] [phone]        Antoine PocheBranch, Jonathan F, MD Follow up in 4 week(s).   Specialty: Cardiology Why: office to call you Contact information: [address] [place] [address] [phone]                  Time coordinating discharge: 39 minutes  Signed:  Timisha Mondry  [facility] 10/29/2020, 9:10 AM

## 2020-10-30 ENCOUNTER — Other Ambulatory Visit: Payer: Self-pay | Admitting: Nurse Practitioner

## 2020-10-30 DIAGNOSIS — E0590 Thyrotoxicosis, unspecified without thyrotoxic crisis or storm: Secondary | ICD-10-CM

## 2020-10-31 ENCOUNTER — Telehealth: Payer: Self-pay

## 2020-10-31 NOTE — Telephone Encounter (Signed)
Transition Care Management Unsuccessful Follow-up Telephone Call  Date of discharge and from where:  10/29/20 from [facility]  Attempts:  1st Attempt  Reason for unsuccessful TCM follow-up call:  Left voice message

## 2020-11-05 ENCOUNTER — Ambulatory Visit: Payer: Self-pay | Admitting: Internal Medicine

## 2020-11-12 ENCOUNTER — Other Ambulatory Visit: Payer: Self-pay

## 2020-11-12 ENCOUNTER — Encounter: Payer: Self-pay | Admitting: Nurse Practitioner

## 2020-11-12 ENCOUNTER — Ambulatory Visit (INDEPENDENT_AMBULATORY_CARE_PROVIDER_SITE_OTHER): Payer: Self-pay | Admitting: Nurse Practitioner

## 2020-11-12 VITALS — BP 144/83 | HR 99 | Ht 67.0 in | Wt 250.0 lb

## 2020-11-12 DIAGNOSIS — E0590 Thyrotoxicosis, unspecified without thyrotoxic crisis or storm: Secondary | ICD-10-CM

## 2020-11-12 DIAGNOSIS — E0500 Thyrotoxicosis with diffuse goiter without thyrotoxic crisis or storm: Secondary | ICD-10-CM

## 2020-11-12 NOTE — Patient Instructions (Signed)
Hyperthyroidism  Hyperthyroidism is when the thyroid gland is too active (overactive). The thyroid gland is a small gland located in the lower front part of the neck, just in front of the windpipe (trachea). This gland makes hormones that help control how the body uses food for energy (metabolism) as well as how the heart and brain function. These hormones also play a role in keeping your bones strong. When the thyroid is overactive, it produces toomuch of a hormone called thyroxine. What are the causes? This condition may be caused by: Graves' disease. This is a disorder in which the body's disease-fighting system (immune system) attacks the thyroid gland. This is the most common cause. Inflammation of the thyroid gland. A tumor in the thyroid gland. Use of certain medicines, including: Prescription thyroid hormone replacement. Herbal supplements that mimic thyroid hormones. Amiodarone therapy. Solid or fluid-filled lumps within your thyroid gland (thyroid nodules). Taking in a large amount of iodine from foods or medicines. What increases the risk? You are more likely to develop this condition if: You are female. You have a family history of thyroid conditions. You smoke tobacco. You use a medicine called lithium. You take medicines that affect the immune system (immunosuppressants). What are the signs or symptoms? Symptoms of this condition include: Nervousness. Inability to tolerate heat. Unexplained weight loss. Diarrhea. Change in the texture of hair or skin. Heart skipping beats or making extra beats. Rapid heart rate. Loss of menstruation. Shaky hands. Fatigue. Restlessness. Sleep problems. Enlarged thyroid gland or a lump in the thyroid (nodule). You may also have symptoms of Graves' disease, which may include: Protruding eyes. Dry eyes. Red or swollen eyes. Problems with vision. How is this diagnosed? This condition may be diagnosed based on: Your symptoms and  medical history. A physical exam. Blood tests. Thyroid ultrasound. This test involves using sound waves to produce images of the thyroid gland. A thyroid scan. A radioactive substance is injected into a vein, and images show how much iodine is present in the thyroid. Radioactive iodine uptake test (RAIU). A small amount of radioactive iodine is given by mouth to see how much iodine the thyroid absorbs after a certain amount of time. How is this treated? Treatment depends on the cause and severity of the condition. Treatment may include: Medicines to reduce the amount of thyroid hormone your body makes. Radioactive iodine treatment (radioiodine therapy). This involves swallowing a small dose of radioactive iodine, in capsule or liquid form, to kill thyroid cells. Surgery to remove part or all of your thyroid gland. You may need to take thyroid hormone replacement medicine for the rest of your life after thyroid surgery. Medicines to help manage your symptoms. Follow these instructions at home:  Take over-the-counter and prescription medicines only as told by your health care provider. Do not use any products that contain nicotine or tobacco, such as cigarettes and e-cigarettes. If you need help quitting, ask your health care provider. Follow any instructions from your health care provider about diet. You may be instructed to limit foods that contain iodine. Keep all follow-up visits as told by your health care provider. This is important. You will need to have blood tests regularly so that your health care provider can monitor your condition. Contact a health care provider if: Your symptoms do not get better with treatment. You have a fever. You are taking thyroid hormone replacement medicine and you: Have symptoms of depression. Feel like you are tired all the time. Gain weight. Get help right   away if: You have chest pain. You have decreased alertness or a change in your awareness. You  have abdominal pain. You feel dizzy. You have a rapid heartbeat. You have an irregular heartbeat. You have difficulty breathing. Summary The thyroid gland is a small gland located in the lower front part of the neck, just in front of the windpipe (trachea). Hyperthyroidism is when the thyroid gland is too active (overactive) and produces too much of a hormone called thyroxine. The most common cause is Graves' disease, a disorder in which your immune system attacks the thyroid gland. Hyperthyroidism can cause various symptoms, such as unexplained weight loss, nervousness, inability to tolerate heat, or changes in your heartbeat. Treatment may include medicine to reduce the amount of thyroid hormone your body makes, radioiodine therapy, surgery, or medicines to manage symptoms. This information is not intended to replace advice given to you by your health care provider. Make sure you discuss any questions you have with your healthcare provider. Document Revised: 12/01/2019 Document Reviewed: 12/01/2019 Elsevier Patient Education  2022 Elsevier Inc.  

## 2020-11-12 NOTE — Progress Notes (Addendum)
11/12/2020     Endocrinology Consult Note    Subjective:    Patient ID: Erin Deleon, female    DOB: 06-11-1975, PCP Heather Roberts, NP.   Past Medical History:  Diagnosis Date   COPD (chronic obstructive pulmonary disease) (HCC)    Depression    Thyroid disease     Past Surgical History:  Procedure Laterality Date   DILATION AND CURETTAGE OF UTERUS     left leg skin graft Left    right ovary surgery Right    had a mass that was removed surgically; ovary still in place   TONSILLECTOMY      Social History   Socioeconomic History   Marital status: Significant Other    Spouse name: Not on file   Number of children: 1   Years of education: Not on file   Highest education level: Not on file  Occupational History   Occupation: Unemployed  Tobacco Use   Smoking status: Former    Packs/day: 0.50    Types: Cigarettes   Smokeless tobacco: Never  Vaping Use   Vaping Use: Never used  Substance and Sexual Activity   Alcohol use: Not Currently    Comment: occ; none since thyroid has been off   Drug use: Never   Sexual activity: Yes    Birth control/protection: None  Other Topics Concern   Not on file  Social History Narrative   Son, 14   Social Determinants of Health   Financial Resource Strain: Not on file  Food Insecurity: Not on file  Transportation Needs: Not on file  Physical Activity: Not on file  Stress: Not on file  Social Connections: Not on file    Family History  Problem Relation Age of Onset   Hypertension Father    Heart attack Father    Thyroid disease Sister    Thyroid disease Brother     Outpatient Encounter Medications as of 11/12/2020  Medication Sig   apixaban (ELIQUIS) 5 MG TABS tablet Take 1 tablet (5 mg total) by mouth 2 (two) times daily.   atenolol (TENORMIN) 25 MG tablet Take 1 tablet (25 mg total) by mouth 2 (two) times daily.   digoxin (LANOXIN) 0.25 MG tablet Take 1 tablet (0.25 mg total) by mouth daily.   furosemide  (LASIX) 40 MG tablet Take 1 tablet (40 mg total) by mouth daily after breakfast.   magnesium oxide (MAG-OX) 400 (240 Mg) MG tablet Take 1 tablet (400 mg total) by mouth daily.   methimazole (TAPAZOLE) 10 MG tablet Take 2 tablets (20 mg total) by mouth daily.   potassium chloride SA (KLOR-CON) 20 MEQ tablet Take 1 tablet (20 mEq total) by mouth daily.   No facility-administered encounter medications on file as of 11/12/2020.    ALLERGIES: No Known Allergies  VACCINATION STATUS:  There is no immunization history on file for this patient.   HPI  Erin Deleon is 45 y.o. female who presents today with a medical history as above. she is being seen in consultation for hyperthyroidism requested by Heather Roberts, NP.  she has been dealing with symptoms of palpitations, diarrhea, tremors, and anxiety for 4-5 months. These symptoms are progressively worsening and troubling to her.  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  She was initiated on Methimazole 20 mg po daily and Atenolol  25 mg po BID at that time.  She reports she used to see an endocrinologist back in [place] when she was in college who confirmed she did have Grave's disease.  She did not proceed with definitive therapy at that time due to scheduling conflicts as she was in school.  She had been treated with Methimazole in the past.   she denies dysphagia, choking, shortness of breath, no recent voice change.    she does have family history of thyroid dysfunction in her sister and grandmother, but denies family hx of thyroid cancer. she denies personal history of goiter.  Denies use of Biotin containing supplements.  she is willing to proceed with appropriate work up and therapy for thyrotoxicosis.   Review of systems  Constitutional: + steadily decreasing body weight, current Body mass index is  39.16 kg/m., no fatigue, no subjective hyperthermia, no subjective hypothermia Eyes: no blurry vision, no xerophthalmia ENT: no sore throat, no nodules palpated in throat, no dysphagia/odynophagia, no hoarseness Cardiovascular: no chest pain, no shortness of breath, + palpitations, + leg swelling Respiratory: no cough, + shortness of breath Gastrointestinal: no nausea/vomiting, + diarrhea Musculoskeletal: no muscle/joint aches Skin: no rashes, no hyperemia Neurological: + tremors, no numbness, no tingling, no dizziness Psychiatric: no depression, + anxiety   Objective:    BP (!) 144/83   Pulse 99   Ht 5\' 7"  (1.702 m)   Wt 250 lb (113.4 kg)   BMI 39.16 kg/m   Wt Readings from Last 3 Encounters:  11/12/20 250 lb (113.4 kg)  10/29/20 264 lb 12.4 oz (120.1 kg)  10/23/20 281 lb (127.5 kg)     BP Readings from Last 3 Encounters:  11/12/20 (!) 144/83  10/29/20 137/84  10/23/20 115/78                        Physical Exam- Limited  Constitutional:  Body mass index is 39.16 kg/m. , not in acute distress, slightly anxious state of mind Eyes:  EOMI, mild exophthalmos L>R Neck: Supple Thyroid: + gross goiter Cardiovascular: irregular rhythm-being treated for AFIB, no murmurs, rubs, or gallops, no edema (was recently started on diuretic) Respiratory: Adequate breathing efforts, no crackles, rales, rhonchi, or wheezing Musculoskeletal: no gross deformities, strength intact in all four extremities, no gross restriction of joint movements Skin:  no rashes, no hyperemia Neurological: slight tremor with outstretched hands, DTR slightly overactive   CMP     Component Value Date/Time   NA 139 10/29/2020 0610   K 4.2 10/29/2020 0610   CL 106 10/29/2020 0610   CO2 27 10/29/2020 0610   GLUCOSE 93 10/29/2020 0610   BUN 9 10/29/2020 0610   CREATININE 0.35 (L) 10/29/2020 0610   CALCIUM 9.1 10/29/2020 0610   PROT 6.4 (L) 10/24/2020 0539   ALBUMIN 3.2 (L) 10/24/2020 0539   AST 93 (H)  10/24/2020 0539   ALT 45 (H) 10/24/2020 0539   ALKPHOS 129 (H) 10/24/2020 0539   BILITOT 3.3 (H) 10/24/2020 0539   GFRNONAA >60 10/29/2020 0610     CBC    Component Value Date/Time   WBC 7.9 10/29/2020 0610   RBC 4.10 10/29/2020 0610   HGB 10.6 (L) 10/29/2020 0610   HCT 33.5 (L) 10/29/2020 0610   PLT 162 10/29/2020 0610   MCV 81.7 10/29/2020 0610   MCH 25.9 (L) 10/29/2020 0610   MCHC 31.6 10/29/2020 0610   RDW 15.9 (H) 10/29/2020 0610   LYMPHSABS 4.1 (H) 10/23/2020 1702  MONOABS 1.2 (H) 10/23/2020 1702   EOSABS 0.3 10/23/2020 1702   BASOSABS 0.0 10/23/2020 1702     Diabetic Labs (most recent): No results found for: HGBA1C  Lipid Panel  No results found for: CHOL, TRIG, HDL, CHOLHDL, VLDL, LDLCALC, LDLDIRECT, LABVLDL   Lab Results  Component Value Date   TSH <0.010 (L) 10/27/2020   TSH <0.010 (L) 10/23/2020   TSH <0.010 (L) 08/15/2020   FREET4 >5.50 (H) 10/23/2020        Assessment & Plan:   1) Hyperthyroidism- r/t Graves Disease  she is being seen at a kind request of Heather Roberts, NP.  her history and most recent labs are reviewed, and she was examined clinically. Subjective and objective findings are consistent with thyrotoxicosis likely from primary hyperthyroidism. The potential risks of untreated thyrotoxicosis and the need for definitive therapy have been discussed in detail with her, and she agrees to proceed with diagnostic workup and treatment plan.    However, we will need to have her elevated thyroid function return to a reasonably safe level before we can taper her off her antithyroid treatment to proceed with uptake and scan.  We discussed the need to proceed with definitive treatment once uptake and scan confirms hyperthyroidism.    Options of therapy are discussed with her.  We discussed the option of treating it with medications including methimazole or PTU which may have side effects including rash, transaminitis, and bone marrow suppression.   We also discussed the option of definitive therapy with RAI ablation of the thyroid. If she is found to have primary hyperthyroidism from Graves' disease , toxic multinodular goiter or toxic nodular goiter the preferred modality of treatment would be I-131 thyroid ablation. Surgery is another choice of treatment in some cases, in her case surgery is not a good fit for presentation with only mild goiter.  -Patient is made aware of the high likelihood of post ablative hypothyroidism with subsequent need for lifelong thyroid hormone replacement. sheunderstands this outcome and she is willing to proceed.      she will return in 8 weeks to assess response to antithyroid treatment and possibly begin tapering her off of Methimazole so uptake and scan can be scheduled  She is already on beta blocker.  No prescriptions were initiated today.    -Patient is advised to maintain close follow up with Heather Roberts, NP for primary care needs.   - Time spent with the patient: 45 minutes, of which >50% was spent in obtaining information about her symptoms, reviewing her previous labs, evaluations, and treatments, counseling her about her hyperthyroidism , and developing a plan to confirm the diagnosis and long term treatment as necessary. Please refer to "Patient Self Inventory" in the Media tab for reviewed elements of pertinent patient history.  Erin Deleon participated in the discussions, expressed understanding, and voiced agreement with the above plans.  All questions were answered to her satisfaction. she is encouraged to contact clinic should she have any questions or concerns prior to her return visit.    Follow up plan: Return in about 8 weeks (around 01/07/2021) for Thyroid follow up, Previsit labs.   Thank you for involving me in the care of this pleasant patient, and I will continue to update you with her progress.    Ronny Bacon, [facility] [address] [place], [place] [address] Phone: [phone] Fax: [phone]  11/12/2020, 1:59 PM

## 2020-11-16 ENCOUNTER — Encounter: Payer: Self-pay | Admitting: Nurse Practitioner

## 2020-11-16 ENCOUNTER — Other Ambulatory Visit: Payer: Self-pay

## 2020-11-16 ENCOUNTER — Ambulatory Visit (INDEPENDENT_AMBULATORY_CARE_PROVIDER_SITE_OTHER): Payer: Medicaid Other | Admitting: Nurse Practitioner

## 2020-11-16 VITALS — BP 112/84 | HR 79 | Temp 97.8°F | Ht 67.0 in | Wt 252.0 lb

## 2020-11-16 DIAGNOSIS — Z0001 Encounter for general adult medical examination with abnormal findings: Secondary | ICD-10-CM

## 2020-11-16 DIAGNOSIS — E0590 Thyrotoxicosis, unspecified without thyrotoxic crisis or storm: Secondary | ICD-10-CM | POA: Diagnosis not present

## 2020-11-16 DIAGNOSIS — I4891 Unspecified atrial fibrillation: Secondary | ICD-10-CM

## 2020-11-16 DIAGNOSIS — Z596 Low income: Secondary | ICD-10-CM

## 2020-11-16 DIAGNOSIS — I509 Heart failure, unspecified: Secondary | ICD-10-CM

## 2020-11-16 DIAGNOSIS — M79672 Pain in left foot: Secondary | ICD-10-CM | POA: Diagnosis not present

## 2020-11-16 DIAGNOSIS — Z59868 Other specified financial insecurity: Secondary | ICD-10-CM

## 2020-11-16 NOTE — Assessment & Plan Note (Signed)
-  followed by endocrinology 

## 2020-11-16 NOTE — Progress Notes (Signed)
Established Patient Office Visit  Subjective:  Patient ID: Erin Deleon, female    DOB: April 07, 1975  Age: 45 y.o. MRN: [id number]  CC:  Chief Complaint  Patient presents with   Annual Exam    CPE    HPI Erin Deleon presents for physical exam. At her last OV, she had thyroid storm and was hospitalized. She is seeing Dr. Dorris Fetch and his team for thyroid concerns, and will see cardiology on 11/30/20 to discuss CHF as a result of her thyroid issues.  She states that she stopped smoking.  She states her SOB with exertion is improving.  She has had problems affording Eliquis and her medicines. She is uninsured, and her Eliquis costs more than her housing.  Past Medical History:  Diagnosis Date   COPD (chronic obstructive pulmonary disease) ([place])    Depression    Thyroid disease    Thyrotoxicosis 10/23/2020    Past Surgical History:  Procedure Laterality Date   DILATION AND CURETTAGE OF UTERUS     left leg skin graft Left    right ovary surgery Right    had a mass that was removed surgically; ovary still in place   TONSILLECTOMY      Family History  Problem Relation Age of Onset   Hypertension Father    Heart attack Father    Thyroid disease Sister    Thyroid disease Brother     Social History   Socioeconomic History   Marital status: Significant Other    Spouse name: Not on file   Number of children: 1   Years of education: Not on file   Highest education level: Not on file  Occupational History   Occupation: Unemployed  Tobacco Use   Smoking status: Former    Packs/day: 0.50    Types: Cigarettes   Smokeless tobacco: Never  Vaping Use   Vaping Use: Never used  Substance and Sexual Activity   Alcohol use: Not Currently    Comment: occ; none since thyroid has been off   Drug use: Never   Sexual activity: Yes    Birth control/protection: None  Other Topics Concern   Not on file  Social History Narrative   Son, 52   Social Determinants of Health   Financial  Resource Strain: Not on file  Food Insecurity: Not on file  Transportation Needs: Not on file  Physical Activity: Not on file  Stress: Not on file  Social Connections: Not on file  Intimate Partner Violence: Not on file    Outpatient Medications Prior to Visit  Medication Sig Dispense Refill   apixaban (ELIQUIS) 5 MG TABS tablet Take 1 tablet (5 mg total) by mouth 2 (two) times daily. 60 tablet 3   atenolol (TENORMIN) 25 MG tablet Take 1 tablet (25 mg total) by mouth 2 (two) times daily. 60 tablet 2   digoxin (LANOXIN) 0.25 MG tablet Take 1 tablet (0.25 mg total) by mouth daily. 30 tablet 2   furosemide (LASIX) 40 MG tablet Take 1 tablet (40 mg total) by mouth daily after breakfast. 30 tablet 11   magnesium oxide (MAG-OX) 400 (240 Mg) MG tablet Take 1 tablet (400 mg total) by mouth daily. 30 tablet 0   methimazole (TAPAZOLE) 10 MG tablet Take 2 tablets (20 mg total) by mouth daily. 60 tablet 2   potassium chloride SA (KLOR-CON) 20 MEQ tablet Take 1 tablet (20 mEq total) by mouth daily. 30 tablet 2   No facility-administered medications prior to visit.  No Known Allergies  ROS Review of Systems  Constitutional:  Positive for activity change. Negative for chills and fever.       Activity level is slowing improving after hospitalization  HENT: Negative.    Eyes: Negative.   Respiratory:  Positive for shortness of breath. Negative for cough and wheezing.        With exertion has been improving  Cardiovascular: Negative.   Gastrointestinal: Negative.   Endocrine: Negative.        Followed by endocrinology for hyperthyroidism  Genitourinary: Negative.   Musculoskeletal:        Left great toe pain; states she pulled her pants while putting them on and fell off balance and slammed her toe in the process  Skin: Negative.   Allergic/Immunologic: Negative.   Neurological: Negative.   Hematological: Negative.   Psychiatric/Behavioral: Negative.       Objective:    Physical  Exam Constitutional:      Appearance: Normal appearance. She is obese.  HENT:     Head: Normocephalic and atraumatic.     Right Ear: Tympanic membrane, ear canal and external ear normal.     Left Ear: Tympanic membrane, ear canal and external ear normal.     Nose: Nose normal.     Mouth/Throat:     Mouth: Mucous membranes are moist.     Pharynx: Oropharynx is clear.  Eyes:     Extraocular Movements: Extraocular movements intact.     Conjunctiva/sclera: Conjunctivae normal.     Pupils: Pupils are equal, round, and reactive to light.  Cardiovascular:     Rate and Rhythm: Normal rate and regular rhythm.     Pulses: Normal pulses.     Heart sounds: Normal heart sounds.  Pulmonary:     Effort: Pulmonary effort is normal.     Breath sounds: Normal breath sounds.  Abdominal:     General: Abdomen is flat. Bowel sounds are normal. There is no distension.     Palpations: Abdomen is soft. There is no mass.     Tenderness: There is no abdominal tenderness. There is no guarding or rebound.     Hernia: No hernia is present.  Musculoskeletal:     Cervical back: Normal range of motion and neck supple.     Comments: Left great toe swollen   Skin:    General: Skin is warm and dry.     Capillary Refill: Capillary refill takes less than 2 seconds.  Neurological:     General: No focal deficit present.     Mental Status: She is alert and oriented to person, place, and time.     Cranial Nerves: No cranial nerve deficit.     Sensory: No sensory deficit.     Motor: No weakness.     Coordination: Coordination normal.     Gait: Gait normal.  Psychiatric:        Mood and Affect: Mood normal.        Behavior: Behavior normal.        Thought Content: Thought content normal.        Judgment: Judgment normal.    BP 112/84 (BP Location: Left Arm, Patient Position: Sitting, Cuff Size: Large)   Pulse 79   Temp 97.8 F (36.6 C) (Oral)   Ht _0  (1.702 m)   Wt 252 lb (114.3 kg)   LMP 11/07/2020  (Exact Date)   SpO2 96%   BMI 39.47 kg/m  Wt Readings from Last 3 Encounters:  11/16/20 252 lb (114.3 kg)  11/12/20 250 lb (113.4 kg)  10/29/20 264 lb 12.4 oz (120.1 kg)     Health Maintenance Due  Topic Date Due   HIV Screening  Never done   Hepatitis C Screening  Never done   PAP SMEAR-Modifier  Never done   COLONOSCOPY (Pts 45-40yr Insurance coverage will need to be confirmed)  Never done   INFLUENZA VACCINE  10/29/2020    There are no preventive care reminders to display for this patient.  Lab Results  Component Value Date   TSH <0.010 (L) 10/27/2020   Lab Results  Component Value Date   WBC 7.9 10/29/2020   HGB 10.6 (L) 10/29/2020   HCT 33.5 (L) 10/29/2020   MCV 81.7 10/29/2020   PLT 162 10/29/2020   Lab Results  Component Value Date   NA 139 10/29/2020   K 4.2 10/29/2020   CO2 27 10/29/2020   GLUCOSE 93 10/29/2020   BUN 9 10/29/2020   CREATININE 0.35 (L) 10/29/2020   BILITOT 3.3 (H) 10/24/2020   ALKPHOS 129 (H) 10/24/2020   AST 93 (H) 10/24/2020   ALT 45 (H) 10/24/2020   PROT 6.4 (L) 10/24/2020   ALBUMIN 3.2 (L) 10/24/2020   CALCIUM 9.1 10/29/2020   ANIONGAP 6 10/29/2020   No results found for: CHOL No results found for: HDL No results found for: LDLCALC No results found for: TRIG No results found for: CHOLHDL No results found for: HGBA1C    Assessment & Plan:   Problem List Items Addressed This Visit       Cardiovascular and Mediastinum   Atrial fibrillation ([place]    -on digoxin and atenolol for rate control -on eliquis for anticoagulation -has upcoming cardiology appointment      Relevant Orders   AMB Referral to [facility]  [place] (congestive heart failure) [place]    -followed by cardiology; has upcoming appt -takes lasix and furosemide         Endocrine   Hyperthyroidism    -followed by endocrinology        Other   Encounter for general adult medical examination with abnormal findings - Primary     -has obesity and left toe swelling, but otherwise is unremarkable (large improvement from previous visit) -no labs today because she has upcoming labs with endocrinology and had recent draws at hospital that were great (CBC and CMP) -she is self-pay and concerned with medical bills, so no lipids until 6 months      Relevant Orders   CMP14+EGFR   CBC with Differential/Platelet   Lipid Panel With LDL/HDL Ratio   Left foot pain    -offered imaging and ortho, but she is not interested at this time -return to clinic if pain does not improve      Other Visit Diagnoses     Patient cannot afford medications       Relevant Orders   AMB Referral to [facility]      No orders of the defined types were placed in this encounter.  [facility]  Follow-up: Return in about 6 months (around 05/19/2021) for Lab follow-up (a-fib) and PAP.    JNoreene Larsson NP

## 2020-11-16 NOTE — Assessment & Plan Note (Addendum)
-  has obesity and left toe swelling, but otherwise is unremarkable (large improvement from previous visit) -no labs today because she has upcoming labs with endocrinology and had recent draws at hospital that were great (CBC and CMP) -she is self-pay and concerned with medical bills, so no lipids until 6 months

## 2020-11-16 NOTE — Assessment & Plan Note (Signed)
-  offered imaging and ortho, but she is not interested at this time -return to clinic if pain does not improve

## 2020-11-16 NOTE — Assessment & Plan Note (Signed)
-  followed by cardiology; has upcoming appt -takes lasix and furosemide

## 2020-11-16 NOTE — Patient Instructions (Addendum)
Please have fasting labs drawn 2-3 days prior to your appointment so we can discuss the results during your office visit.  For medication assistance, I sent in a referral to our social worker. There are a few options you can try on your own.  The easiest is calling [phone], and that is [employer]'s patient assistance line. They will ask you a few questions and my be able to provide free or low-cost eliquis.  If that doesn't work, you can try  [facility]- this is for uninsured people (if unable to get medicaid). ForgetParking[employer]dk -I talked with our pharmacist, and he said they have their own pharmacy and they use xarelto instead of eliquis (same class of medicine, so we may need to swap if you aren't eligible for savings on eliquis).  If neither of those work, call the cardiology office and ask for samples.

## 2020-11-16 NOTE — Assessment & Plan Note (Signed)
-  on digoxin and atenolol for rate control -on eliquis for anticoagulation -has upcoming cardiology appointment

## 2020-11-19 ENCOUNTER — Telehealth: Payer: Self-pay | Admitting: *Deleted

## 2020-11-19 NOTE — Chronic Care Management (AMB) (Signed)
  Care Management   Note  11/19/2020 Name: Martisha Toulouse MRN: [id number] DOB: 08-10-1975  Lowella Kindley is a 45 y.o. year old female who is a primary care patient of Heather Roberts, NP. I reached out to [facility] by phone today in response to a referral sent by Ms. Shell Khokhar's PCP, Heather Roberts, NP.    Ms. Beazley was given information about care management services today including:  Care management services include personalized support from designated clinical staff supervised by her physician, including individualized plan of care and coordination with other care providers 24/7 contact phone numbers for assistance for urgent and routine care needs. The patient may stop care management services at any time by phone call to the office staff.  Patient did not agree to enrollment in care management services and does not wish to consider at this time.  Follow up plan: Patient declines engagement by the care management team. Appropriate care team members and provider have been notified via electronic communication.  The care management team is available to follow up with the patient after provider conversation with the patient regarding recommendation for care management engagement and subsequent re-referral to the care management team.   [facility] Guide, Embedded Care Coordination [facility]  Care Management  Direct Dial: [phone]

## 2020-11-19 NOTE — Chronic Care Management (AMB) (Signed)
  Care Management   Outreach Note  11/19/2020 Name: Erin Deleon MRN: [id number] DOB: 03-21-1976  Referred by: Heather Roberts, NP Reason for referral : Care Coordination (Initial outreach to schedule referral with PharmD and Licensed Clinical SW )   An unsuccessful telephone outreach was attempted today. The patient was referred to the case management team for assistance with care management and care coordination.   Follow Up Plan:  A HIPAA compliant phone message was left for the patient providing contact information and requesting a return call.  The care management team will reach out to the patient again over the next 7 days.  If patient returns call to provider office, please advise to call Embedded Care Management Care Guide Misty Stanley* at [phone].Gwenevere Ghazi  Care Guide, Embedded Care Coordination [facility] Management  Direct Dial: [phone]

## 2020-11-30 ENCOUNTER — Ambulatory Visit (INDEPENDENT_AMBULATORY_CARE_PROVIDER_SITE_OTHER): Payer: Medicaid Other | Admitting: Student

## 2020-11-30 ENCOUNTER — Encounter: Payer: Self-pay | Admitting: Student

## 2020-11-30 VITALS — BP 120/72 | HR 100 | Ht 67.0 in | Wt 254.8 lb

## 2020-11-30 DIAGNOSIS — I4891 Unspecified atrial fibrillation: Secondary | ICD-10-CM

## 2020-11-30 DIAGNOSIS — I428 Other cardiomyopathies: Secondary | ICD-10-CM | POA: Diagnosis not present

## 2020-11-30 DIAGNOSIS — E0590 Thyrotoxicosis, unspecified without thyrotoxic crisis or storm: Secondary | ICD-10-CM

## 2020-11-30 DIAGNOSIS — Z79899 Other long term (current) drug therapy: Secondary | ICD-10-CM | POA: Diagnosis not present

## 2020-11-30 MED ORDER — ATENOLOL 25 MG PO TABS: 37.5000 mg | ORAL_TABLET | Freq: Two times a day (BID) | ORAL | 5 refills | Status: DC

## 2020-11-30 MED ORDER — DIGOXIN 250 MCG PO TABS: 0.2500 mg | ORAL_TABLET | Freq: Every day | ORAL | 5 refills | Status: DC

## 2020-11-30 MED ORDER — POTASSIUM CHLORIDE CRYS ER 20 MEQ PO TBCR: 20.0000 meq | EXTENDED_RELEASE_TABLET | Freq: Every day | ORAL | 5 refills | Status: DC

## 2020-11-30 MED ORDER — APIXABAN 5 MG PO TABS: 5.0000 mg | ORAL_TABLET | Freq: Two times a day (BID) | ORAL | 5 refills | Status: DC

## 2020-11-30 MED ORDER — MAGNESIUM OXIDE -MG SUPPLEMENT 400 (240 MG) MG PO TABS: 400.0000 mg | ORAL_TABLET | Freq: Every day | ORAL | 5 refills | Status: AC

## 2020-11-30 NOTE — Progress Notes (Signed)
Cardiology Office Note    Date:  11/30/2020   ID:  Erin Deleon, DOB [birth year], MRN [id number]  PCP:  Heather Roberts, NP  Cardiologist: Dina Rich, MD    Chief Complaint  Patient presents with   Hospitalization Follow-up    History of Present Illness:    Erin Deleon is a 45 y.o. female with past medical history of COPD, depression, hyperthyroidism and newly diagnosed atrial fibrillation who presents to the office today for hospital follow-up.  She presented to [facility] ED in 09/2020 after going to see her PCP to establish care and was found to be in atrial fibrillation with RVR. She denied any chest pain or palpitations but did report worsening dyspnea over the past 2 weeks. Was found to be in thyrotoxicosis and was started back on Methimazole. She was initially on IV Diltiazem and Propanolol but given that her EF was reduced at 40 to 45% by echocardiogram, IV Cardizem was discontinued and she required initiation of Digoxin given her soft BP.  Was discharged on Atenolol 25mg  BID along with Digoxin 0.25 mg daily. She was started on Eliquis 5 mg twice daily for anticoagulation. A rate-control strategy was recommended until her thyroid status had stabilized.  In talking with the patient today, she reports significant improvement in her fatigue since her recent admission. She did follow-up with Endocrinology in the interim and is scheduled for follow-up labs in the coming weeks. She denies any recent chest pain or dyspnea on exertion. No recent orthopnea, PND or pitting edema. She does experience occasional palpitations but no persistent symptoms.  Reports good compliance with Eliquis and she has not missed any doses in the interim.   Past Medical History:  Diagnosis Date   Atrial fibrillation [facility])    a. new diagnosis in 09/2020   Cardiomyopathy [facility])    a. EF at 40-45% by echo in 09/2020   COPD (chronic obstructive pulmonary disease) (HCC)    Depression    Thyroid disease     Thyrotoxicosis 10/23/2020    Past Surgical History:  Procedure Laterality Date   DILATION AND CURETTAGE OF UTERUS     left leg skin graft Left    right ovary surgery Right    had a mass that was removed surgically; ovary still in place   TONSILLECTOMY      Current Medications: Outpatient Medications Prior to Visit  Medication Sig Dispense Refill   furosemide (LASIX) 40 MG tablet Take 1 tablet (40 mg total) by mouth daily after breakfast. 30 tablet 11   methimazole (TAPAZOLE) 10 MG tablet Take 2 tablets (20 mg total) by mouth daily. 60 tablet 2   apixaban (ELIQUIS) 5 MG TABS tablet Take 1 tablet (5 mg total) by mouth 2 (two) times daily. 60 tablet 3   atenolol (TENORMIN) 25 MG tablet Take 1 tablet (25 mg total) by mouth 2 (two) times daily. 60 tablet 2   digoxin (LANOXIN) 0.25 MG tablet Take 1 tablet (0.25 mg total) by mouth daily. 30 tablet 2   magnesium oxide (MAG-OX) 400 (240 Mg) MG tablet Take 1 tablet (400 mg total) by mouth daily. 30 tablet 0   potassium chloride SA (KLOR-CON) 20 MEQ tablet Take 1 tablet (20 mEq total) by mouth daily. 30 tablet 2   No facility-administered medications prior to visit.     Allergies:   Patient has no known allergies.   Social History   Socioeconomic History   Marital status: Significant Other    Spouse  name: Not on file   Number of children: 1   Years of education: Not on file   Highest education level: Not on file  Occupational History   Occupation: Unemployed  Tobacco Use   Smoking status: Former    Packs/day: 0.50    Types: Cigarettes   Smokeless tobacco: Never  Vaping Use   Vaping Use: Never used  Substance and Sexual Activity   Alcohol use: Not Currently    Comment: occ; none since thyroid has been off   Drug use: Never   Sexual activity: Yes    Birth control/protection: None  Other Topics Concern   Not on file  Social History Narrative   Son, 14   Social Determinants of Health   Financial Resource Strain: Not on file   Food Insecurity: Not on file  Transportation Needs: Not on file  Physical Activity: Not on file  Stress: Not on file  Social Connections: Not on file     Family History:  The patient's family history includes Heart attack in her father; Hypertension in her father; Thyroid disease in her brother and sister.   Review of Systems:    Please see the history of present illness.     All other systems reviewed and are otherwise negative except as noted above.   Physical Exam:    VS:  BP 120/72   Pulse 100   Ht 5\' 7"  (1.702 m)   Wt 254 lb 12.8 oz (115.6 kg)   LMP 11/07/2020 (Exact Date)   SpO2 97%   BMI 39.91 kg/m    General: Well developed, well nourished,female appearing in no acute distress. Head: Normocephalic, atraumatic. Neck: No carotid bruits. JVD not elevated.  Lungs: Respirations regular and unlabored, without wheezes or rales.  Heart: Irregularly irregular. No S3 or S4.  No murmur, no rubs, or gallops appreciated. Abdomen: Appears non-distended. No obvious abdominal masses. Msk:  Strength and tone appear normal for age. No obvious joint deformities or effusions. Extremities: No clubbing or cyanosis. No pitting edema.  Distal pedal pulses are 2+ bilaterally. Neuro: Alert and oriented X 3. Moves all extremities spontaneously. No focal deficits noted. Psych:  Responds to questions appropriately with a normal affect. Skin: No rashes or lesions noted  Wt Readings from Last 3 Encounters:  11/30/20 254 lb 12.8 oz (115.6 kg)  11/16/20 252 lb (114.3 kg)  11/12/20 250 lb (113.4 kg)     Studies/Labs Reviewed:   EKG:  EKG is ordered today.  The ekg ordered today demonstrates atrial fibrillation, HR 99 with no acute ST changes.   Recent Labs: 10/24/2020: ALT 45 10/27/2020: B Natriuretic Peptide 1,004.0; TSH <0.010 10/28/2020: Magnesium 2.0 10/29/2020: BUN 9; Creatinine, Ser 0.35; Hemoglobin 10.6; Platelets 162; Potassium 4.2; Sodium 139   Lipid Panel No results found for:  CHOL, TRIG, HDL, CHOLHDL, VLDL, LDLCALC, LDLDIRECT  Additional studies/ records that were reviewed today include:   Echocardiogram: 09/2020 IMPRESSIONS     1. Somewhat limited visualization of LV endocardium, grossly LVEF appears  around 40-45%. Would recommend limited contrast study once heart rates are  better controlled. . Left ventricular ejection fraction, by estimation, is  40 to 45%. The left ventricle   has mildly decreased function. The left ventricle demonstrates global  hypokinesis. Left ventricular diastolic parameters are indeterminate.   2. Right ventricular systolic function is low normal. The right  ventricular size is moderately enlarged. Tricuspid regurgitation signal is  inadequate for assessing PA pressure.   3. Left atrial size was  mildly dilated.   4. Right atrial size was mildly dilated.   5. A small pericardial effusion is present. The pericardial effusion is  circumferential.   6. The mitral valve is normal in structure. Mild mitral valve  regurgitation. No evidence of mitral stenosis.   7. The tricuspid valve is abnormal. Tricuspid valve regurgitation is mild  to moderate.   8. The aortic valve has an indeterminant number of cusps. Aortic valve  regurgitation is not visualized. No aortic stenosis is present.   9. The inferior vena cava is dilated in size with <50% respiratory  variability, suggesting right atrial pressure of 15 mmHg.   Assessment:    1. Atrial fibrillation, unspecified type (HCC)   2. Medication management   3. NICM (nonischemic cardiomyopathy) (HCC)   4. Hyperthyroidism      Plan:   In order of problems listed above:  1. Persistent Atrial Fibrillation - This was a new diagnosis for the patient occurring in the setting of uncontrolled hyperthyroidism. A rate-control strategy was recommended until her thyroid is under better control. - She does continue to experience intermittent palpitations and her heart rate is in the 90's  during today's visit. She is currently on Atenolol 25 mg twice daily and Digoxin 0.25 mg daily.  Will titrate Atenolol to 37.5 mg twice daily. Will recheck BMET, Dig Level and Mg with upcoming labs.  - Continue Eliquis 5 mg twice daily for anticoagulation. Will arrange for follow-up in 6 weeks and reviewed that she may ultimately require DCCV if she does not convert back to normal sinus rhythm with management of her hyperthyroidism.  2. HFmrEF - Her EF was at 40 to 45% by most recent echocardiogram but imaging was not ideal given her elevated rates. RV function was at the lower end of normal as well. Will plan to titrate Atenolol as outlined above and continue Digoxin.  Pending BP response, can consider the addition of a low-dose ARB at the time of her next visit. An SGLT2 inhibitor would also be an option as well now that she does have insurance coverage for her medications. Would ultimately plan for a repeat echocardiogram in 2 to 3 months for reassessment of her EF. If RV function remains reduced, would also refer to Pulmonology for a sleep study.  3.  Hyperthyroidism - She is being followed by Endocrinology and remains on Methimazole 20 mg daily.    Medication Adjustments/Labs and Tests Ordered: Current medicines are reviewed at length with the patient today.  Concerns regarding medicines are outlined above.  Medication changes, Labs and Tests ordered today are listed in the Patient Instructions below. Patient Instructions  Medication Instructions:   Increase Atenolol to 37.5 mg Daily   *If you need a refill on your cardiac medications before your next appointment, please call your pharmacy*   Lab Work: Your physician recommends that you return for lab work in: BMET, Digoxin, Magnesium  If you have labs (blood work) drawn today and your tests are completely normal, you will receive your results only by: MyChart Message (if you have MyChart) OR A paper copy in the mail If you have any  lab test that is abnormal or we need to change your treatment, we will call you to review the results.   Testing/Procedures: NONE    Follow-Up: At [facility], you and your health needs are our priority.  As part of our continuing mission to provide you with exceptional heart care, we have created designated Provider Care Teams.  These Care Teams include your primary Cardiologist (physician) and Advanced Practice Providers (APPs -  Physician Assistants and Nurse Practitioners) who all work together to provide you with the care you need, when you need it.  We recommend signing up for the patient portal called "MyChart".  Sign up information is provided on this After Visit Summary.  MyChart is used to connect with patients for Virtual Visits (Telemedicine).  Patients are able to view lab/test results, encounter notes, upcoming appointments, etc.  Non-urgent messages can be sent to your provider as well.   To learn more about what you can do with MyChart, go to [email or web address].    Your next appointment:   6 week(s)  The format for your next appointment:   In Person  Provider:   Dina Rich, MD or Randall An, PA-C   Other Instructions Thank you for choosing [facility]!     Signed, Ellsworth Lennox, PA-C  11/30/2020 5:19 PM    [facility] [address] [place], [place] [address] Phone: [phone] Fax: [phone]

## 2020-11-30 NOTE — Patient Instructions (Signed)
Medication Instructions:   Increase Atenolol to 37.5 mg Daily   *If you need a refill on your cardiac medications before your next appointment, please call your pharmacy*   Lab Work: Your physician recommends that you return for lab work in: BMET, Digoxin, Magnesium  If you have labs (blood work) drawn today and your tests are completely normal, you will receive your results only by: MyChart Message (if you have MyChart) OR A paper copy in the mail If you have any lab test that is abnormal or we need to change your treatment, we will call you to review the results.   Testing/Procedures: NONE    Follow-Up: At [facility], you and your health needs are our priority.  As part of our continuing mission to provide you with exceptional heart care, we have created designated Provider Care Teams.  These Care Teams include your primary Cardiologist (physician) and Advanced Practice Providers (APPs -  Physician Assistants and Nurse Practitioners) who all work together to provide you with the care you need, when you need it.  We recommend signing up for the patient portal called "MyChart".  Sign up information is provided on this After Visit Summary.  MyChart is used to connect with patients for Virtual Visits (Telemedicine).  Patients are able to view lab/test results, encounter notes, upcoming appointments, etc.  Non-urgent messages can be sent to your provider as well.   To learn more about what you can do with MyChart, go to [email or web address].    Your next appointment:   6 week(s)  The format for your next appointment:   In Person  Provider:   Dina Rich, MD or Randall An, PA-C   Other Instructions Thank you for choosing [facility]!

## 2021-01-01 LAB — CBC WITH DIFFERENTIAL/PLATELET
Basophils Absolute: 0 10*3/uL (ref 0.0–0.2)
Basos: 0 %
EOS (ABSOLUTE): 0.2 10*3/uL (ref 0.0–0.4)
Eos: 2 %
Hematocrit: 42.2 % (ref 34.0–46.6)
Hemoglobin: 13.6 g/dL (ref 11.1–15.9)
Immature Grans (Abs): 0 10*3/uL (ref 0.0–0.1)
Immature Granulocytes: 0 %
Lymphocytes Absolute: 4.3 10*3/uL — ABNORMAL HIGH (ref 0.7–3.1)
Lymphs: 42 %
MCH: 25.2 pg — ABNORMAL LOW (ref 26.6–33.0)
MCHC: 32.2 g/dL (ref 31.5–35.7)
MCV: 78 fL — ABNORMAL LOW (ref 79–97)
Monocytes Absolute: 0.7 10*3/uL (ref 0.1–0.9)
Monocytes: 6 %
Neutrophils Absolute: 5.1 10*3/uL (ref 1.4–7.0)
Neutrophils: 50 %
Platelets: 301 10*3/uL (ref 150–450)
RBC: 5.39 x10E6/uL — ABNORMAL HIGH (ref 3.77–5.28)
RDW: 15.7 % — ABNORMAL HIGH (ref 11.7–15.4)
WBC: 10.4 10*3/uL (ref 3.4–10.8)

## 2021-01-01 LAB — CMP14+EGFR
ALT: 13 IU/L (ref 0–32)
AST: 16 IU/L (ref 0–40)
Albumin/Globulin Ratio: 1.3 (ref 1.2–2.2)
Albumin: 3.8 g/dL (ref 3.8–4.8)
Alkaline Phosphatase: 187 IU/L — ABNORMAL HIGH (ref 44–121)
BUN/Creatinine Ratio: 22 (ref 9–23)
BUN: 12 mg/dL (ref 6–24)
Bilirubin Total: 0.6 mg/dL (ref 0.0–1.2)
CO2: 22 mmol/L (ref 20–29)
Calcium: 9.3 mg/dL (ref 8.7–10.2)
Chloride: 102 mmol/L (ref 96–106)
Creatinine, Ser: 0.55 mg/dL — ABNORMAL LOW (ref 0.57–1.00)
Globulin, Total: 2.9 g/dL (ref 1.5–4.5)
Glucose: 96 mg/dL (ref 70–99)
Potassium: 4.4 mmol/L (ref 3.5–5.2)
Sodium: 138 mmol/L (ref 134–144)
Total Protein: 6.7 g/dL (ref 6.0–8.5)
eGFR: 115 mL/min/{1.73_m2} (ref >59)

## 2021-01-01 LAB — LIPID PANEL WITH LDL/HDL RATIO
Cholesterol, Total: 142 mg/dL (ref 100–199)
HDL: 50 mg/dL (ref >39)
LDL Chol Calc (NIH): 73 mg/dL (ref 0–99)
LDL/HDL Ratio: 1.5 ratio (ref 0.0–3.2)
Triglycerides: 104 mg/dL (ref 0–149)
VLDL Cholesterol Cal: 19 mg/dL (ref 5–40)

## 2021-01-01 LAB — MAGNESIUM: Magnesium: 2.1 mg/dL (ref 1.6–2.3)

## 2021-01-01 LAB — T4, FREE: Free T4: 3.13 ng/dL — ABNORMAL HIGH (ref 0.82–1.77)

## 2021-01-01 LAB — DIGOXIN LEVEL: Digoxin, Serum: <0.4 ng/mL — ABNORMAL LOW (ref 0.5–0.9)

## 2021-01-01 LAB — T3, FREE: T3, Free: 10.6 pg/mL — ABNORMAL HIGH (ref 2.0–4.4)

## 2021-01-01 LAB — TSH: TSH: <0.005 u[IU]/mL — ABNORMAL LOW (ref 0.450–4.500)

## 2021-01-01 NOTE — Progress Notes (Signed)
RBC count increased since her last set of labs and alkaline phosphatase is elevated (but this enzyme is non-specific). If she is having an acute issue, we can meet up. Otherwise, we will see her at her next appointment. Hemoglobin and HCT are all back in the normal range.

## 2021-01-04 NOTE — Patient Instructions (Signed)
Radioiodine (I-131) Therapy for Hyperthyroidism, Care After This sheet gives you information about how to care for yourself after your procedure. Your health care provider may also give you more specific instructions. If you have problems or questions, contact your health careprovider. What can I expect after the procedure? After the procedure, it is common to have a sore throat or mild neck pain forseveral months. Follow these instructions at home: For the first 48 hours after the procedure:  Do not use public bathrooms. Flush twice after using the toilet. Use tissue to wipe up any urine on the toilet seat. Men should sit down to urinate to reduce the risk of splashing. Take a bath or shower every day. Rinse the sink and tub after each use. Do not make food for other people. Wash your sheets, towels, and clothes each day. Wash them separately from other people's items. Drink enough fluid to keep your urine pale yellow.  Pregnancy and breastfeeding Do not try to become pregnant for as long as you are told by your health care provider. This may be for up to 1 year after your procedure. Use a method of birth control (contraception) to prevent pregnancy. Talk to your health care provider about what form of contraception is right for you. Do not breastfeed for as long as you are told by your health care provider, if this applies. General instructions  Avoid close contact with other people. Avoiding contact with children and pregnant women is especially important. Do this for 1 week after your procedure. Try to keep a distance of about 6 feet (1.8 m) from others. Sleep alone. Do not have close intimate contact of any kind. This includes kissing, physical contact, and sexual intercourse. If you have children, arrange for child care. Do not ride in vehicles with other people. Do not stay in a hotel. Stay home from work or school as told by your health care provider. Take over-the-counter and  prescription medicines only as told by your health care provider. This includes any thyroid medicines. When your health care provider tells you it is safe to travel, carry a note from the health care provider to explain that you have had radioiodine therapy. This is needed because radioactivity may set off detectors in airports or other places. Do not share utensils--such as silverware, plates, or cups--for as long as told by your health care provider. Use disposable utensils, or clean your utensils separately from those of others. Wash your hands often. If soap and water are not available, use hand sanitizer. Keep all follow-up visits as told by your health care provider. This is important. Follow-up visits may be required every 1-2 months after treatment.  Contact a health care provider if you: Have pain that gets worse or does not get better with medicine. Have a dry mouth. Lose your sense of taste. Become pregnant within 1 year of your procedure, if this applies. Feel unusually tired (fatigued). Have very dry skin. Start to lose your hair. Have bowel movements that are less frequent or more difficult than usual (constipation). Have unexplained weight gain. Always feel cold. Summary After the procedure, it is common to have a sore throat or mild neck pain for several months. Do not try to become pregnant for as long as you are told by your health care provider. Avoid close contact with other people. Avoiding contact with children and pregnant women is especially important. Do this for 1 week after your procedure. Keep all follow-up visits as told by   your health care provider. This is important. This information is not intended to replace advice given to you by your health care provider. Make sure you discuss any questions you have with your healthcare provider. Document Revised: 04/29/2018 Document Reviewed: 04/29/2018 Elsevier Patient Education  2022 [employer]  

## 2021-01-06 ENCOUNTER — Encounter: Payer: Self-pay | Admitting: Nurse Practitioner

## 2021-01-07 ENCOUNTER — Ambulatory Visit: Payer: Self-pay | Admitting: Nurse Practitioner

## 2021-01-07 ENCOUNTER — Ambulatory Visit (INDEPENDENT_AMBULATORY_CARE_PROVIDER_SITE_OTHER): Payer: Medicaid Other | Admitting: Nurse Practitioner

## 2021-01-07 ENCOUNTER — Encounter: Payer: Self-pay | Admitting: Nurse Practitioner

## 2021-01-07 ENCOUNTER — Other Ambulatory Visit: Payer: Self-pay

## 2021-01-07 VITALS — BP 98/63 | HR 97 | Ht 67.0 in | Wt 244.2 lb

## 2021-01-07 DIAGNOSIS — E0500 Thyrotoxicosis with diffuse goiter without thyrotoxic crisis or storm: Secondary | ICD-10-CM

## 2021-01-07 DIAGNOSIS — E0590 Thyrotoxicosis, unspecified without thyrotoxic crisis or storm: Secondary | ICD-10-CM

## 2021-01-07 NOTE — Progress Notes (Signed)
01/07/2021     Endocrinology Follow Up Note    Subjective:    Patient ID: Erin Deleon, female    DOB: 1976-03-28, PCP Heather Roberts, NP.   Past Medical History:  Diagnosis Date   Atrial fibrillation [facility])    a. new diagnosis in 09/2020   Cardiomyopathy [facility])    a. 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    Past Surgical History:  Procedure Laterality Date   DILATION AND CURETTAGE OF UTERUS     left leg skin graft Left    right ovary surgery Right    had a mass that was removed surgically; ovary still in place   TONSILLECTOMY      Social History   Socioeconomic History   Marital status: Significant Other    Spouse name: Not on file   Number of children: 1   Years of education: Not on file   Highest education level: Not on file  Occupational History   Occupation: Unemployed  Tobacco Use   Smoking status: Former    Packs/day: 0.50    Types: Cigarettes   Smokeless tobacco: Never  Vaping Use   Vaping Use: Never used  Substance and Sexual Activity   Alcohol use: Not Currently    Comment: occ; none since thyroid has been off   Drug use: Never   Sexual activity: Yes    Birth control/protection: None  Other Topics Concern   Not on file  Social History Narrative   Son, 14   Social Determinants of Health   Financial Resource Strain: Not on file  Food Insecurity: Not on file  Transportation Needs: Not on file  Physical Activity: Not on file  Stress: Not on file  Social Connections: Not on file    Family History  Problem Relation Age of Onset   Hypertension Father    Heart attack Father    Thyroid disease Sister    Thyroid disease Brother     Outpatient Encounter Medications as of 01/07/2021  Medication Sig   apixaban (ELIQUIS) 5 MG TABS tablet Take 1 tablet (5 mg total) by mouth 2 (two) times daily.   atenolol (TENORMIN) 25 MG  tablet Take 1.5 tablets (37.5 mg total) by mouth 2 (two) times daily.   digoxin (LANOXIN) 0.25 MG tablet Take 1 tablet (0.25 mg total) by mouth daily.   furosemide (LASIX) 40 MG tablet Take 1 tablet (40 mg total) by mouth daily after breakfast.   magnesium oxide (MAG-OX) 400 (240 Mg) MG tablet Take 1 tablet (400 mg total) by mouth daily.   methimazole (TAPAZOLE) 10 MG tablet Take 2 tablets (20 mg total) by mouth daily.   potassium chloride SA (KLOR-CON) 20 MEQ tablet Take 1 tablet (20 mEq total) by mouth daily.   No facility-administered encounter medications on file as of 01/07/2021.    ALLERGIES: No Known Allergies  VACCINATION STATUS:  There is no immunization history on file for this patient.   HPI  Erin Deleon is 45 y.o. female who presents today with a medical history as above. she is being seen in follow up after being seen in consultation for hyperthyroidism requested by Heather Roberts, NP.  she has been dealing with symptoms of palpitations, diarrhea, tremors, and anxiety for 4-5 months. These symptoms are progressively worsening and troubling to her.  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  She was initiated on Methimazole 20 mg po daily and Atenolol 25 mg po BID at that time.  She reports she used to see an endocrinologist back in [place] when she was in college who confirmed she did have Grave's disease.  She did not proceed with definitive therapy at that time due to scheduling conflicts as she was in school.  She had been treated with Methimazole in the past.   she denies dysphagia, choking, shortness of breath, no recent voice change.    she does have family history of thyroid dysfunction in her sister and grandmother, but denies family hx of thyroid cancer. she denies personal history of goiter.  Denies use of Biotin containing  supplements.  she is willing to proceed with appropriate work up and therapy for thyrotoxicosis.   Review of systems  Constitutional: + steadily decreasing body weight, current Body mass index is 38.25 kg/m., no fatigue, no subjective hyperthermia, no subjective hypothermia Eyes: no blurry vision, no xerophthalmia, vertigo ENT: no sore throat, no nodules palpated in throat, no dysphagia/odynophagia, no hoarseness Cardiovascular: no chest pain, no shortness of breath, + palpitations-improving, + leg swelling Respiratory: no cough, + shortness of breath-improving Gastrointestinal: no nausea/vomiting, + diarrhea Musculoskeletal: no muscle/joint aches Skin: no rashes, no hyperemia Neurological: + tremors-improving, no numbness, no tingling, no dizziness Psychiatric: no depression, + anxiety-improving   Objective:    BP 98/63   Pulse 97   Ht 5\' 7"  (1.702 m)   Wt 244 lb 3.2 oz (110.8 kg)   BMI 38.25 kg/m   Wt Readings from Last 3 Encounters:  01/07/21 244 lb 3.2 oz (110.8 kg)  11/30/20 254 lb 12.8 oz (115.6 kg)  11/16/20 252 lb (114.3 kg)     BP Readings from Last 3 Encounters:  01/07/21 98/63  11/30/20 120/72  11/16/20 112/84                        Physical Exam- Limited  Constitutional:  Body mass index is 38.25 kg/m. , not in acute distress, normal state of mind Eyes:  EOMI, mild exophthalmos L>R Neck: Supple Thyroid: + gross goiter Cardiovascular: irregular rhythm-being treated for AFIB, no murmurs, rubs, or gallops, no edema (was recently started on diuretic) Respiratory: Adequate breathing efforts, no crackles, rales, rhonchi, or wheezing Musculoskeletal: no gross deformities, strength intact in all four extremities, no gross restriction of joint movements Skin:  no rashes, no hyperemia Neurological: slight tremor with outstretched hands, DTR slightly overactive   CMP     Component Value Date/Time   NA 138 12/31/2020 0843   K 4.4 12/31/2020 0843   CL 102  12/31/2020 0843   CO2 22 12/31/2020 0843   GLUCOSE 96 12/31/2020 0843   GLUCOSE 93 10/29/2020 0610   BUN 12 12/31/2020 0843   CREATININE 0.55 (L) 12/31/2020 0843   CALCIUM 9.3 12/31/2020 0843   PROT 6.7 12/31/2020 0843   ALBUMIN 3.8 12/31/2020 0843   AST 16 12/31/2020 0843   ALT 13 12/31/2020 0843   ALKPHOS 187 (H) 12/31/2020 0843   BILITOT 0.6 12/31/2020 0843   GFRNONAA >60 10/29/2020 0610     CBC    Component Value Date/Time   WBC 10.4 12/31/2020 0843   WBC 7.9 10/29/2020 0610   RBC 5.39 (H) 12/31/2020 0843   RBC 4.10 10/29/2020  0610   HGB 13.6 12/31/2020 0843   HCT 42.2 12/31/2020 0843   PLT 301 12/31/2020 0843   MCV 78 (L) 12/31/2020 0843   MCH 25.2 (L) 12/31/2020 0843   MCH 25.9 (L) 10/29/2020 0610   MCHC 32.2 12/31/2020 0843   MCHC 31.6 10/29/2020 0610   RDW 15.7 (H) 12/31/2020 0843   LYMPHSABS 4.3 (H) 12/31/2020 0843   MONOABS 1.2 (H) 10/23/2020 1702   EOSABS 0.2 12/31/2020 0843   BASOSABS 0.0 12/31/2020 0843     Diabetic Labs (most recent): No results found for: HGBA1C  Lipid Panel     Component Value Date/Time   CHOL 142 12/31/2020 0843   TRIG 104 12/31/2020 0843   HDL 50 12/31/2020 0843   LDLCALC 73 12/31/2020 0843   LABVLDL 19 12/31/2020 0843     Lab Results  Component Value Date   TSH <0.005 (L) 12/31/2020   TSH <0.010 (L) 10/27/2020   TSH <0.010 (L) 10/23/2020   TSH <0.010 (L) 08/15/2020   FREET4 3.13 (H) 12/31/2020   FREET4 >5.50 (H) 10/23/2020        Assessment & Plan:   1) Hyperthyroidism- r/t Graves Disease  she is being seen at a kind request of Heather Roberts, NP.  her history and most recent labs are reviewed, and she was examined clinically. Subjective and objective findings are consistent with thyrotoxicosis likely from primary hyperthyroidism. The potential risks of untreated thyrotoxicosis and the need for definitive therapy have been discussed in detail with her, and she agrees to proceed with diagnostic workup and  treatment plan.    Repeat thyroid function tests are showing slight improvement with the Methimazole, however still not at safe enough range to taper her off and proceed with uptake and scan.  She is advised to stay at same dose of Methimazole 20 mg po daily and Atenolol 37.5 mg po twice daily.    she will return in 8 weeks to assess response to antithyroid treatment and possibly begin tapering her off of Methimazole so uptake and scan can be scheduled  She is already on beta blocker.  No prescriptions were initiated today.    -Patient is advised to maintain close follow up with Heather Roberts, NP for primary care needs.    I spent 20 minutes in the care of the patient today including review of labs from Thyroid Function, CMP, and other relevant labs ; imaging/biopsy records (current and previous including abstractions from other facilities); face-to-face time discussing  her lab results and symptoms, medications doses, her options of short and long term treatment based on the latest standards of care / guidelines;   and documenting the encounter.  Erin Deleon  participated in the discussions, expressed understanding, and voiced agreement with the above plans.  All questions were answered to her satisfaction. she is encouraged to contact clinic should she have any questions or concerns prior to her return visit.   Follow up plan: Return in about 8 weeks (around 03/04/2021) for Thyroid follow up, Previsit labs.   Thank you for involving me in the care of this pleasant patient, and I will continue to update you with her progress.    Ronny Bacon, [facility] [address] [place], [place] [address] Phone: [phone] Fax: [phone]  01/07/2021, 3:19 PM

## 2021-01-07 NOTE — Telephone Encounter (Signed)
LEFT MESSAGE

## 2021-01-14 NOTE — Progress Notes (Deleted)
Cardiology Office Note    Date:  01/14/2021   ID:  Erin Deleon, DOB Oct 18, 1975, MRN [id number]  PCP:  Heather Roberts, NP  Cardiologist: Dina Rich, MD    No chief complaint on file.   History of Present Illness:    Erin Deleon is a 45 y.o. female with past medical history of COPD, depression, hyperthyroidism and newly diagnosed atrial fibrillation (diagnosed in 09/2020 and rate-control recommended until thyroid function has normalized) who presents to the office today for 6-week follow-up.   She was examined by myself in 11/2020 following an admission for atrial fibrillation with RVR in the setting of uncontrolled hyperthyroidism. She reported significant improvement in her fatigue but was still experiencing intermittent palpitations. Atenolol was therefore titrated to 37.5mg  BID with her being continued on Digoxin 0.25mg  daily and Eliquis 5mg  BID for anticoagulation. She did have follow-up labs with Endocrinology and her TSH remained abnormal but had improved to < 0.005 with Free T4 at 3.13.    Past Medical History:  Diagnosis Date   Atrial fibrillation (HCC)    a. new diagnosis in 09/2020   Cardiomyopathy [facility])    a. 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 60 tablet 5   atenolol (TENORMIN) 25 MG tablet Take 1.5 tablets (37.5 mg total) by mouth 2 (two) times daily. 90 tablet 5   digoxin (LANOXIN) 0.25 MG tablet Take 1 tablet (0.25 mg total) by  mouth daily. 30 tablet 5   furosemide (LASIX) 40 MG tablet Take 1 tablet (40 mg total) by mouth daily after breakfast. 30 tablet 11   magnesium oxide (MAG-OX) 400 (240 Mg) MG tablet Take 1 tablet (400 mg total) by mouth daily. 30 tablet 5   methimazole (TAPAZOLE) 10 MG tablet Take 2 tablets (20 mg total) by mouth daily. 60 tablet 2   potassium chloride SA (KLOR-CON) 20 MEQ tablet Take 1 tablet (20 mEq total) by mouth daily. 30 tablet 5   No facility-administered medications prior to visit.     Allergies:   Patient has no known allergies.   Social History   Socioeconomic History   Marital status: Significant Other    Spouse name: Not on file   Number of children: 1   Years of education: Not on file   Highest education level: Not on file  Occupational History   Occupation: Unemployed  Tobacco Use   Smoking status: Former    Packs/day: 0.50    Types: Cigarettes   Smokeless tobacco: Never  Vaping Use   Vaping Use: Never used  Substance and Sexual Activity   Alcohol use: Not Currently    Comment: occ; none since thyroid has been off   Drug use: Never   Sexual activity: Yes    Birth control/protection: None  Other Topics Concern   Not on file  Social History Narrative   Son,  14   Social Determinants of Corporate investment banker Strain: Not on file  Food Insecurity: Not on file  Transportation Needs: Not on file  Physical Activity: Not on file  Stress: Not on file  Social Connections: Not on file     Family History:  The patient's ***family history includes Heart attack in her father; Hypertension in her father; Thyroid disease in her brother and sister.   Review of Systems:    Please see the history of present illness.     All other systems reviewed and are otherwise negative except as noted above.   Physical Exam:    VS:  There were no vitals taken for this visit.   General: Well developed, well nourished,female appearing in no acute distress. Head:  Normocephalic, atraumatic. Neck: No carotid bruits. JVD not elevated.  Lungs: Respirations regular and unlabored, without wheezes or rales.  Heart: ***Regular rate and rhythm. No S3 or S4.  No murmur, no rubs, or gallops appreciated. Abdomen: Appears non-distended. No obvious abdominal masses. Msk:  Strength and tone appear normal for age. No obvious joint deformities or effusions. Extremities: No clubbing or cyanosis. No edema.  Distal pedal pulses are 2+ bilaterally. Neuro: Alert and oriented X 3. Moves all extremities spontaneously. No focal deficits noted. Psych:  Responds to questions appropriately with a normal affect. Skin: No rashes or lesions noted  Wt Readings from Last 3 Encounters:  01/07/21 244 lb 3.2 oz (110.8 kg)  11/30/20 254 lb 12.8 oz (115.6 kg)  11/16/20 252 lb (114.3 kg)        Studies/Labs Reviewed:   EKG:  EKG is*** ordered today.  The ekg ordered today demonstrates ***  Recent Labs: 10/27/2020: B Natriuretic Peptide 1,004.0 12/31/2020: ALT 13; BUN 12; Creatinine, Ser 0.55; Hemoglobin 13.6; Magnesium 2.1; Platelets 301; Potassium 4.4; Sodium 138; TSH <0.005   Lipid Panel    Component Value Date/Time   CHOL 142 12/31/2020 0843   TRIG 104 12/31/2020 0843   HDL 50 12/31/2020 0843   LDLCALC 73 12/31/2020 0843    Additional studies/ records that were reviewed today include:   Echocardiogram: 09/2020 IMPRESSIONS     1. Somewhat limited visualization of LV endocardium, grossly LVEF appears  around 40-45%. Would recommend limited contrast study once heart rates are  better controlled. . Left ventricular ejection fraction, by estimation, is  40 to 45%. The left ventricle   has mildly decreased function. The left ventricle demonstrates global  hypokinesis. Left ventricular diastolic parameters are indeterminate.   2. Right ventricular systolic function is low normal. The right  ventricular size is moderately enlarged. Tricuspid regurgitation signal is   inadequate for assessing PA pressure.   3. Left atrial size was mildly dilated.   4. Right atrial size was mildly dilated.   5. A small pericardial effusion is present. The pericardial effusion is  circumferential.   6. The mitral valve is normal in structure. Mild mitral valve  regurgitation. No evidence of mitral stenosis.   7. The tricuspid valve is abnormal. Tricuspid valve regurgitation is mild  to moderate.   8. The aortic valve has an indeterminant number of cusps. Aortic valve  regurgitation is not visualized. No aortic stenosis is present.   9. The inferior vena cava is dilated in size with <50% respiratory  variability, suggesting right atrial pressure of 15 mmHg.   Assessment:    No diagnosis found.   Plan:   In order of problems listed above:  ***  Shared Decision Making/Informed Consent:   {Are you ordering a CV Procedure (e.g. stress test, cath, DCCV, TEE, etc)?   Press F2        :[id number]}    Medication Adjustments/Labs and Tests Ordered: Current medicines are reviewed at length with the patient today.  Concerns regarding medicines are outlined above.  Medication changes, Labs and Tests ordered today are listed in the Patient Instructions below. There are no Patient Instructions on file for this visit.   Signed, Ellsworth Lennox, PA-C  01/14/2021 7:40 PM    [facility] [address] [place], [place] [address] Phone: [phone] Fax: [phone]

## 2021-01-15 ENCOUNTER — Ambulatory Visit: Payer: Medicaid Other | Admitting: Student

## 2021-01-18 ENCOUNTER — Ambulatory Visit: Payer: Medicaid Other | Admitting: Cardiology

## 2021-01-26 ENCOUNTER — Encounter: Payer: Self-pay | Admitting: Nurse Practitioner

## 2021-01-28 MED ORDER — METHIMAZOLE 10 MG PO TABS: 20.0000 mg | ORAL_TABLET | Freq: Every day | ORAL | 2 refills | Status: DC

## 2021-02-26 LAB — T4, FREE: Free T4: 2.94 ng/dL — ABNORMAL HIGH (ref 0.82–1.77)

## 2021-02-26 LAB — TSH: TSH: <0.005 u[IU]/mL — ABNORMAL LOW (ref 0.450–4.500)

## 2021-02-26 LAB — T3, FREE: T3, Free: 9 pg/mL — ABNORMAL HIGH (ref 2.0–4.4)

## 2021-03-04 ENCOUNTER — Encounter: Payer: Self-pay | Admitting: Nurse Practitioner

## 2021-03-04 ENCOUNTER — Other Ambulatory Visit: Payer: Self-pay

## 2021-03-04 ENCOUNTER — Ambulatory Visit (INDEPENDENT_AMBULATORY_CARE_PROVIDER_SITE_OTHER): Payer: Medicaid Other | Admitting: Nurse Practitioner

## 2021-03-04 VITALS — BP 124/77 | HR 93 | Ht 67.0 in | Wt 246.2 lb

## 2021-03-04 DIAGNOSIS — E0590 Thyrotoxicosis, unspecified without thyrotoxic crisis or storm: Secondary | ICD-10-CM | POA: Diagnosis not present

## 2021-03-04 DIAGNOSIS — E0500 Thyrotoxicosis with diffuse goiter without thyrotoxic crisis or storm: Secondary | ICD-10-CM | POA: Diagnosis not present

## 2021-03-04 MED ORDER — PREDNISONE 5 MG PO TABS: 5.0000 mg | ORAL_TABLET | Freq: Every day | ORAL | 0 refills | Status: AC

## 2021-03-04 NOTE — Progress Notes (Signed)
03/04/2021     Endocrinology Follow Up Note    Subjective:    Patient ID: Erin Deleon, female    DOB: [birth year], PCP Heather Roberts, NP.   Past Medical History:  Diagnosis Date   Atrial fibrillation [facility])    a. new diagnosis in 09/2020   Cardiomyopathy [facility])    a. 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    Past Surgical History:  Procedure Laterality Date   DILATION AND CURETTAGE OF UTERUS     left leg skin graft Left    right ovary surgery Right    had a mass that was removed surgically; ovary still in place   TONSILLECTOMY      Social History   Socioeconomic History   Marital status: Significant Other    Spouse name: Not on file   Number of children: 1   Years of education: Not on file   Highest education level: Not on file  Occupational History   Occupation: Unemployed  Tobacco Use   Smoking status: Former    Packs/day: 0.50    Types: Cigarettes   Smokeless tobacco: Never  Vaping Use   Vaping Use: Never used  Substance and Sexual Activity   Alcohol use: Not Currently    Comment: occ; none since thyroid has been off   Drug use: Never   Sexual activity: Yes    Birth control/protection: None  Other Topics Concern   Not on file  Social History Narrative   Son, 14   Social Determinants of Health   Financial Resource Strain: Not on file  Food Insecurity: Not on file  Transportation Needs: Not on file  Physical Activity: Not on file  Stress: Not on file  Social Connections: Not on file    Family History  Problem Relation Age of Onset   Hypertension Father    Heart attack Father    Thyroid disease Sister    Thyroid disease Brother     Outpatient Encounter Medications as of 03/04/2021  Medication Sig   apixaban (ELIQUIS) 5 MG TABS tablet Take 1 tablet (5 mg total) by mouth 2 (two) times daily.   atenolol (TENORMIN) 25 MG tablet  Take 1.5 tablets (37.5 mg total) by mouth 2 (two) times daily.   digoxin (LANOXIN) 0.25 MG tablet Take 1 tablet (0.25 mg total) by mouth daily.   furosemide (LASIX) 40 MG tablet Take 1 tablet (40 mg total) by mouth daily after breakfast.   magnesium oxide (MAG-OX) 400 (240 Mg) MG tablet Take 1 tablet (400 mg total) by mouth daily.   methimazole (TAPAZOLE) 10 MG tablet Take 2 tablets (20 mg total) by mouth daily.   potassium chloride SA (KLOR-CON) 20 MEQ tablet Take 1 tablet (20 mEq total) by mouth daily.   predniSONE (DELTASONE) 5 MG tablet Take 1 tablet (5 mg total) by mouth daily with breakfast for 15 days.   No facility-administered encounter medications on file as of 03/04/2021.    ALLERGIES: No Known Allergies  VACCINATION STATUS:  There is no immunization history on file for this patient.   HPI  Erin Deleon is 45 y.o. female who presents today with a medical history as above. she is being seen in follow up after being seen in consultation for hyperthyroidism requested by Heather Roberts, NP.  she has been dealing with symptoms of palpitations,  diarrhea, tremors, and anxiety for 4-5 months. These symptoms are progressively worsening and troubling to her.  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  She was initiated on Methimazole 20 mg po daily and Atenolol 25 mg po BID at that time.  She reports she used to see an endocrinologist back in [place] when she was in college who confirmed she did have Grave's disease.  She did not proceed with definitive therapy at that time due to scheduling conflicts as she was in school.  She had been treated with Methimazole in the past.   she denies dysphagia, choking, shortness of breath, no recent voice change.    she does have family history of thyroid dysfunction in her sister and grandmother, but denies family hx  of thyroid cancer. she denies personal history of goiter.  Denies use of Biotin containing supplements.  she is willing to proceed with appropriate work up and therapy for thyrotoxicosis.   Review of systems  Constitutional: + stable body weight, current Body mass index is 38.56 kg/m., no fatigue, no subjective hyperthermia, no subjective hypothermia Eyes: no blurry vision, no xerophthalmia, vertigo ENT: no sore throat, no nodules palpated in throat, no dysphagia/odynophagia, no hoarseness, throat swelling R>L Cardiovascular: no chest pain, no shortness of breath, + palpitations-improving, + leg swelling Respiratory: no cough, + shortness of breath-improving Gastrointestinal: no nausea/vomiting, + diarrhea Musculoskeletal: no muscle/joint aches Skin: no rashes, no hyperemia Neurological: + tremors-improving, no numbness, no tingling, no dizziness Psychiatric: no depression, + anxiety-improving, + irritability   Objective:    BP 124/77   Pulse 93   Ht 5\' 7"  (1.702 m)   Wt 246 lb 3.2 oz (111.7 kg)   BMI 38.56 kg/m   Wt Readings from Last 3 Encounters:  03/04/21 246 lb 3.2 oz (111.7 kg)  01/07/21 244 lb 3.2 oz (110.8 kg)  11/30/20 254 lb 12.8 oz (115.6 kg)     BP Readings from Last 3 Encounters:  03/04/21 124/77  01/07/21 98/63  11/30/20 120/72                        Physical Exam- Limited  Constitutional:  Body mass index is 38.56 kg/m. , not in acute distress, normal state of mind Eyes:  EOMI, mild exophthalmos L>R Neck: Supple Thyroid: + gross goiter R>L Cardiovascular: irregular rhythm-being treated for AFIB, no murmurs, rubs, or gallops, no edema (was recently started on diuretic) Respiratory: Adequate breathing efforts, no crackles, rales, rhonchi, or wheezing Musculoskeletal: no gross deformities, strength intact in all four extremities, no gross restriction of joint movements Skin:  no rashes, no hyperemia Neurological: slight tremor with outstretched hands, DTR  slightly overactive   CMP     Component Value Date/Time   NA 138 12/31/2020 0843   K 4.4 12/31/2020 0843   CL 102 12/31/2020 0843   CO2 22 12/31/2020 0843   GLUCOSE 96 12/31/2020 0843   GLUCOSE 93 10/29/2020 0610   BUN 12 12/31/2020 0843   CREATININE 0.55 (L) 12/31/2020 0843   CALCIUM 9.3 12/31/2020 0843   PROT 6.7 12/31/2020 0843   ALBUMIN 3.8 12/31/2020 0843   AST 16 12/31/2020 0843   ALT 13 12/31/2020 0843   ALKPHOS 187 (H) 12/31/2020 0843   BILITOT 0.6 12/31/2020 0843   GFRNONAA >60 10/29/2020 0610     CBC  Component Value Date/Time   WBC 10.4 12/31/2020 0843   WBC 7.9 10/29/2020 0610   RBC 5.39 (H) 12/31/2020 0843   RBC 4.10 10/29/2020 0610   HGB 13.6 12/31/2020 0843   HCT 42.2 12/31/2020 0843   PLT 301 12/31/2020 0843   MCV 78 (L) 12/31/2020 0843   MCH 25.2 (L) 12/31/2020 0843   MCH 25.9 (L) 10/29/2020 0610   MCHC 32.2 12/31/2020 0843   MCHC 31.6 10/29/2020 0610   RDW 15.7 (H) 12/31/2020 0843   LYMPHSABS 4.3 (H) 12/31/2020 0843   MONOABS 1.2 (H) 10/23/2020 1702   EOSABS 0.2 12/31/2020 0843   BASOSABS 0.0 12/31/2020 0843     Diabetic Labs (most recent): No results found for: HGBA1C  Lipid Panel     Component Value Date/Time   CHOL 142 12/31/2020 0843   TRIG 104 12/31/2020 0843   HDL 50 12/31/2020 0843   LDLCALC 73 12/31/2020 0843   LABVLDL 19 12/31/2020 0843     Lab Results  Component Value Date   TSH <0.005 (L) 02/25/2021   TSH <0.005 (L) 12/31/2020   TSH <0.010 (L) 10/27/2020   TSH <0.010 (L) 10/23/2020   TSH <0.010 (L) 08/15/2020   FREET4 2.94 (H) 02/25/2021   FREET4 3.13 (H) 12/31/2020   FREET4 >5.50 (H) 10/23/2020      Latest Reference Range & Units 08/15/20 18:54 10/23/20 17:02 10/27/20 03:34 12/31/20 08:42 02/25/21 11:36  TSH 0.450 - 4.500 uIU/mL <0.010 (L) <0.010 (L) <0.010 (L) <0.005 (L) <0.005 (L)  Triiodothyronine,Free,Serum 2.0 - 4.4 pg/mL  27.3 (H)  10.6 (H) 9.0 (H)  T4,Free(Direct) 0.82 - 1.77 ng/dL  >1.91 (H)  4.78 (H)  2.95 (H)  (L): Data is abnormally low (H): Data is abnormally high   Assessment & Plan:   1) Hyperthyroidism- r/t Graves Disease  she is being seen at a kind request of Heather Roberts, NP.  her history and most recent labs are reviewed, and she was examined clinically. Subjective and objective findings are consistent with thyrotoxicosis likely from primary hyperthyroidism. The potential risks of untreated thyrotoxicosis and the need for definitive therapy have been discussed in detail with her, and she agrees to proceed with diagnostic workup and treatment plan.    Repeat thyroid function tests are showing slight improvement with the Methimazole, however still not at safe enough range to taper her off and proceed with uptake and scan.  She is advised to stay at same dose of Methimazole 20 mg po daily and Atenolol 37.5 mg po twice daily.  Will also order round of Prednisone 5 mg PO daily x 15 days for her acute inflammation of her thyroid.    she will return in another 8 weeks to assess response to antithyroid treatment and possibly begin tapering her off of Methimazole so uptake and scan can be scheduled  She is already on beta blocker.      -Patient is advised to maintain close follow up with Heather Roberts, NP for primary care needs.    I spent 23 minutes in the care of the patient today including review of labs from Thyroid Function, CMP, and other relevant labs ; imaging/biopsy records (current and previous including abstractions from other facilities); face-to-face time discussing  her lab results and symptoms, medications doses, her options of short and long term treatment based on the latest standards of care / guidelines;   and documenting the encounter.  Erin Deleon  participated in the discussions, expressed understanding, and voiced agreement with  the above plans.  All questions were answered to her satisfaction. she is encouraged to contact clinic should she have any  questions or concerns prior to her return visit.   Follow up plan: Return in about 8 weeks (around 04/29/2021) for Thyroid follow up, Previsit labs.   Thank you for involving me in the care of this pleasant patient, and I will continue to update you with her progress.    Ronny Bacon, [facility] [address] [place], [place] [address] Phone: [phone] Fax: [phone]  03/04/2021, 1:43 PM

## 2021-03-05 NOTE — Progress Notes (Signed)
Cardiology Office Note    Date:  03/06/2021   ID:  Erin Deleon, DOB December 27, 1975, MRN [id number]  PCP:  Erin Larsson, NP  Cardiologist: Erin Dolly, MD    Chief Complaint  Patient presents with   Follow-up    6 week visit    History of Present Illness:    Erin Deleon is a 45 y.o. female with past medical history of COPD, depression, hyperthyroidism and newly diagnosed atrial fibrillation (diagnosed in 09/2020 and rate-control recommended until thyroid function has normalized) who presents to the office today for 6-week follow-up.    She was examined by myself in 11/2020 following an admission for atrial fibrillation with RVR in the setting of uncontrolled hyperthyroidism. She reported significant improvement in her fatigue but was still experiencing intermittent palpitations. Atenolol was therefore titrated to 37.5mg  BID with her being continued on Digoxin 0.25mg  daily and Eliquis 5mg  BID for anticoagulation. She did have follow-up labs with Endocrinology and her TSH remained abnormal but had improved to < 0.005 with Free T4 at 3.13. Repeat labs again in 01/2021 showed her TSH remained < 0.005 with Free T4 at 2.94.  In talking with the patient today, she reports worsening fatigue over the past month and is hopeful symptoms will improve with treatment of her hyperthyroidism. She is scheduled for follow-up labs in 03/2021. She denies any recent chest pain or dyspnea. Does report occasional palpitations but these are typically most notable at night. Reports her heart rate has overall been well controlled at home. She has experienced some intermittent hypotension at times and says her SBP was in the 90's earlier this week. BP is at 142/90 during today's visit.  Past Medical History:  Diagnosis Date   Atrial fibrillation ([place])    a. new diagnosis in 09/2020   Cardiomyopathy [facility])    a. EF at 40-45% by echo in 09/2020   COPD (chronic obstructive pulmonary disease) ([place])    Depression     Thyroid disease    Thyrotoxicosis 10/23/2020    Past Surgical History:  Procedure Laterality Date   DILATION AND CURETTAGE OF UTERUS     left leg skin graft Left    right ovary surgery Right    had a mass that was removed surgically; ovary still in place   TONSILLECTOMY      Current Medications: Outpatient Medications Prior to Visit  Medication Sig Dispense Refill   apixaban (ELIQUIS) 5 MG TABS tablet Take 1 tablet (5 mg total) by mouth 2 (two) times daily. 60 tablet 5   atenolol (TENORMIN) 25 MG tablet Take 1.5 tablets (37.5 mg total) by mouth 2 (two) times daily. 90 tablet 5   digoxin (LANOXIN) 0.25 MG tablet Take 1 tablet (0.25 mg total) by mouth daily. 30 tablet 5   furosemide (LASIX) 40 MG tablet Take 1 tablet (40 mg total) by mouth daily after breakfast. 30 tablet 11   magnesium oxide (MAG-OX) 400 (240 Mg) MG tablet Take 1 tablet (400 mg total) by mouth daily. 30 tablet 5   methimazole (TAPAZOLE) 10 MG tablet Take 2 tablets (20 mg total) by mouth daily. 60 tablet 2   potassium chloride SA (KLOR-CON) 20 MEQ tablet Take 1 tablet (20 mEq total) by mouth daily. 30 tablet 5   predniSONE (DELTASONE) 5 MG tablet Take 1 tablet (5 mg total) by mouth daily with breakfast for 15 days. 15 tablet 0   No facility-administered medications prior to visit.     Allergies:   Patient  has no known allergies.   Social History   Socioeconomic History   Marital status: Significant Other    Spouse name: Not on file   Number of children: 1   Years of education: Not on file   Highest education level: Not on file  Occupational History   Occupation: Unemployed  Tobacco Use   Smoking status: Every Day    Packs/day: 0.25    Types: Cigarettes   Smokeless tobacco: Never  Vaping Use   Vaping Use: Never used  Substance and Sexual Activity   Alcohol use: Not Currently    Comment: occ; none since thyroid has been off   Drug use: Never   Sexual activity: Yes    Birth control/protection: None   Other Topics Concern   Not on file  Social History Narrative   Son, 55   Social Determinants of Health   Financial Resource Strain: Not on file  Food Insecurity: Not on file  Transportation Needs: Not on file  Physical Activity: Not on file  Stress: Not on file  Social Connections: Not on file     Family History:  The patient's family history includes Heart attack in her father; Hypertension in her father; Thyroid disease in her brother and sister.   Review of Systems:    Please see the history of present illness.     All other systems reviewed and are otherwise negative except as noted above.   Physical Exam:    VS:  BP (!) 142/90   Pulse 79   Ht 5\' 8"  (1.727 m)   Wt 246 lb 12.8 oz (111.9 kg)   SpO2 98%   BMI 37.53 kg/m    General: Well developed, well nourished,female appearing in no acute distress. Head: Normocephalic, atraumatic. Neck: No carotid bruits. JVD not elevated.  Lungs: Respirations regular and unlabored, without wheezes or rales.  Heart: Irregularly irregular.  No S3 or S4.  No murmur, no rubs, or gallops appreciated. Abdomen: Appears non-distended. No obvious abdominal masses. Msk:  Strength and tone appear normal for age. No obvious joint deformities or effusions. Extremities: No clubbing or cyanosis. No pitting edema.  Distal pedal pulses are 2+ bilaterally. Neuro: Alert and oriented X 3. Moves all extremities spontaneously. No focal deficits noted. Psych:  Responds to questions appropriately with a normal affect. Skin: No rashes or lesions noted  Wt Readings from Last 3 Encounters:  03/06/21 246 lb 12.8 oz (111.9 kg)  03/04/21 246 lb 3.2 oz (111.7 kg)  01/07/21 244 lb 3.2 oz (110.8 kg)    Studies/Labs Reviewed:   EKG:  EKG is not ordered today.    Recent Labs: 10/27/2020: B Natriuretic Peptide 1,004.0 12/31/2020: ALT 13; BUN 12; Creatinine, Ser 0.55; Hemoglobin 13.6; Magnesium 2.1; Platelets 301; Potassium 4.4; Sodium 138 02/25/2021: TSH  <0.005   Lipid Panel    Component Value Date/Time   CHOL 142 12/31/2020 0843   TRIG 104 12/31/2020 0843   HDL 50 12/31/2020 0843   LDLCALC 73 12/31/2020 0843    Additional studies/ records that were reviewed today include:   Echocardiogram: 09/2020 IMPRESSIONS   1. Somewhat limited visualization of LV endocardium, grossly LVEF appears  around 40-45%. Would recommend limited contrast study once heart rates are  better controlled. . Left ventricular ejection fraction, by estimation, is  40 to 45%. The left ventricle   has mildly decreased function. The left ventricle demonstrates global  hypokinesis. Left ventricular diastolic parameters are indeterminate.   2. Right ventricular systolic function is  low normal. The right  ventricular size is moderately enlarged. Tricuspid regurgitation signal is  inadequate for assessing PA pressure.   3. Left atrial size was mildly dilated.   4. Right atrial size was mildly dilated.   5. A small pericardial effusion is present. The pericardial effusion is  circumferential.   6. The mitral valve is normal in structure. Mild mitral valve  regurgitation. No evidence of mitral stenosis.   7. The tricuspid valve is abnormal. Tricuspid valve regurgitation is mild  to moderate.   8. The aortic valve has an indeterminant number of cusps. Aortic valve  regurgitation is not visualized. No aortic stenosis is present.   9. The inferior vena cava is dilated in size with <50% respiratory  variability, suggesting right atrial pressure of 15 mmHg.   Assessment:    1. Longstanding persistent atrial fibrillation ([place])   2. NICM (nonischemic cardiomyopathy) ([place])   3. Need for immunization against influenza   4. Hyperthyroidism      Plan:   In order of problems listed above:  1. Persistent Atrial Fibrillation - This was diagnosed in the setting of uncontrolled hyperthyroidism and rate control was recommended until her thyroid function has normalized.  She does report occasional palpitations which are typically most notable at night but her heart rate has improved into the 70's to 80's. She does remain in atrial fibrillation by examination today. Will continue her on Atenolol 37.5 mg twice daily along with Digoxin 0.25 mg daily. She also remains on Eliquis 5 mg twice daily for anticoagulation. - She is scheduled to follow-up with Endocrinology in 03/2021 and will arrange for follow-up with [place] in 05/2021 or 05/2021. Once thyroid function has normalized, can arrange for DCCV if still in atrial fibrillation.   2. HFmrEF - Her ejection fraction was mildly reduced at 40 to 45% by echocardiogram in 09/2020 and likely due to uncontrolled rates at that time. She denies any orthopnea, PND or pitting edema and volume status has been well controlled with Lasix 40 mg daily. Creatinine stable at 0.55 in 12/2020. Will continue this along with Atenolol 37.5 mg twice daily and Digoxin 0.25mg  daily. Did not add ACE-I/ARB/ARNI/Spiro given her variable BP at home as SBP has been in the 90's at times.   3. Hyperthyroidism - Followed by Endocrinology and she remains on Methimazole. TSH was still < 0.005 by recent labs but T4 had improved to 2.94.  4. Need for Influenza vaccine - Flu shot administered by nursing staff during today's visit.    Medication Adjustments/Labs and Tests Ordered: Current medicines are reviewed at length with the patient today.  Concerns regarding medicines are outlined above.  Medication changes, Labs and Tests ordered today are listed in the Patient Instructions below. Patient Instructions  Medication Instructions:  You may take an extra 0.5 tablet of Atenolol for palpitations *If you need a refill on your cardiac medications before your next appointment, please call your pharmacy*   Lab Work: None If you have labs (blood work) drawn today and your tests are completely normal, you will receive your results only by: [place] (if  you have MyChart) OR A paper copy in the mail If you have any lab test that is abnormal or we need to change your treatment, we will call you to review the results.   Testing/Procedures: Noen   Follow-Up: At [facility], you and your health needs are our priority.  As part of our continuing mission to provide you with exceptional heart  care, we have created designated Provider Care Teams.  These Care Teams include your primary Cardiologist (physician) and Advanced Practice Providers (APPs -  Physician Assistants and Nurse Practitioners) who all work together to provide you with the care you need, when you need it.  We recommend signing up for the patient portal called "MyChart".  Sign up information is provided on this After Visit Summary.  MyChart is used to connect with patients for Virtual Visits (Telemedicine).  Patients are able to view lab/test results, encounter notes, upcoming appointments, etc.  Non-urgent messages can be sent to your provider as well.   To learn more about what you can do with MyChart, go to [email or web address].    Your next appointment:   48month(s)- MARCH  The format for your next appointment:   In Person  Provider:   You may see Dina Rich, MD or one of the following Advanced Practice Providers on your designated Care Team:   Randall An, PA-C   :1}    Other Instructions     Signed, Ellsworth Lennox, PA-C  03/06/2021 4:50 PM    [facility] [address] [place], [place] [address] Phone: [phone] Fax: [phone]

## 2021-03-06 ENCOUNTER — Encounter: Payer: Self-pay | Admitting: Student

## 2021-03-06 ENCOUNTER — Other Ambulatory Visit: Payer: Self-pay

## 2021-03-06 ENCOUNTER — Ambulatory Visit: Payer: Medicaid Other | Admitting: Student

## 2021-03-06 VITALS — BP 142/90 | HR 79 | Ht 68.0 in | Wt 246.8 lb

## 2021-03-06 DIAGNOSIS — I4811 Longstanding persistent atrial fibrillation: Secondary | ICD-10-CM | POA: Diagnosis not present

## 2021-03-06 DIAGNOSIS — E0590 Thyrotoxicosis, unspecified without thyrotoxic crisis or storm: Secondary | ICD-10-CM

## 2021-03-06 DIAGNOSIS — I428 Other cardiomyopathies: Secondary | ICD-10-CM | POA: Diagnosis not present

## 2021-03-06 DIAGNOSIS — Z23 Encounter for immunization: Secondary | ICD-10-CM

## 2021-03-06 NOTE — Patient Instructions (Signed)
Medication Instructions:  You may take an extra 0.5 tablet of Atenolol for palpitations *If you need a refill on your cardiac medications before your next appointment, please call your pharmacy*   Lab Work: None If you have labs (blood work) drawn today and your tests are completely normal, you will receive your results only by: MyChart Message (if you have MyChart) OR A paper copy in the mail If you have any lab test that is abnormal or we need to change your treatment, we will call you to review the results.   Testing/Procedures: Noen   Follow-Up: At [facility], you and your health needs are our priority.  As part of our continuing mission to provide you with exceptional heart care, we have created designated Provider Care Teams.  These Care Teams include your primary Cardiologist (physician) and Advanced Practice Providers (APPs -  Physician Assistants and Nurse Practitioners) who all work together to provide you with the care you need, when you need it.  We recommend signing up for the patient portal called "MyChart".  Sign up information is provided on this After Visit Summary.  MyChart is used to connect with patients for Virtual Visits (Telemedicine).  Patients are able to view lab/test results, encounter notes, upcoming appointments, etc.  Non-urgent messages can be sent to your provider as well.   To learn more about what you can do with MyChart, go to [email or web address].    Your next appointment:   24month(s)- MARCH  The format for your next appointment:   In Person  Provider:   You may see Dina Rich, MD or one of the following Advanced Practice Providers on your designated Care Team:   Randall An, PA-C   :1}    Other Instructions

## 2021-04-18 ENCOUNTER — Other Ambulatory Visit: Payer: Self-pay | Admitting: Nurse Practitioner

## 2021-04-24 ENCOUNTER — Other Ambulatory Visit: Payer: Self-pay | Admitting: Nurse Practitioner

## 2021-04-24 DIAGNOSIS — E0590 Thyrotoxicosis, unspecified without thyrotoxic crisis or storm: Secondary | ICD-10-CM

## 2021-04-24 LAB — T3, FREE: T3, Free: 11.3 pg/mL — ABNORMAL HIGH (ref 2.0–4.4)

## 2021-04-24 LAB — T4, FREE: Free T4: 3.81 ng/dL — ABNORMAL HIGH (ref 0.82–1.77)

## 2021-04-24 LAB — TSH: TSH: <0.005 u[IU]/mL — ABNORMAL LOW (ref 0.450–4.500)

## 2021-04-24 MED ORDER — PREDNISONE 20 MG PO TABS: 20.0000 mg | ORAL_TABLET | Freq: Every day | ORAL | 0 refills | Status: DC

## 2021-04-24 MED ORDER — METHIMAZOLE 10 MG PO TABS: ORAL_TABLET | ORAL | 2 refills | Status: DC

## 2021-04-24 NOTE — Progress Notes (Signed)
Received patients bloodwork.  Her thyroid function has worsened.  I called patient and instructed her to take Methimazole 20 mg po in the morning and 10 mg po in the evening as well as continuing her Atenolol for symptom management.  I also prescribed a more aggressive course of oral steroids Prednisone 20 mg po daily x 15 days.  Will repeat TFTs in 2 weeks for surveillance.

## 2021-04-24 NOTE — Progress Notes (Signed)
She is currently on Methimazole 20 mg po daily, Atenolol for symptom management, and recently completed a round of low-dose prednisone.  What do you suggest I have her do next?  Labs are worse.

## 2021-04-30 ENCOUNTER — Ambulatory Visit: Payer: Medicaid Other | Admitting: Nurse Practitioner

## 2021-05-08 LAB — T4, FREE: Free T4: 2.61 ng/dL — ABNORMAL HIGH (ref 0.82–1.77)

## 2021-05-08 LAB — T3, FREE: T3, Free: 5.7 pg/mL — ABNORMAL HIGH (ref 2.0–4.4)

## 2021-05-08 LAB — TSH: TSH: <0.005 u[IU]/mL — ABNORMAL LOW (ref 0.450–4.500)

## 2021-05-09 ENCOUNTER — Ambulatory Visit (INDEPENDENT_AMBULATORY_CARE_PROVIDER_SITE_OTHER): Payer: Medicaid Other | Admitting: Nurse Practitioner

## 2021-05-09 ENCOUNTER — Encounter: Payer: Self-pay | Admitting: Nurse Practitioner

## 2021-05-09 ENCOUNTER — Other Ambulatory Visit: Payer: Self-pay

## 2021-05-09 VITALS — BP 120/83 | HR 102 | Ht 68.0 in | Wt 253.0 lb

## 2021-05-09 DIAGNOSIS — E0590 Thyrotoxicosis, unspecified without thyrotoxic crisis or storm: Secondary | ICD-10-CM | POA: Diagnosis not present

## 2021-05-09 DIAGNOSIS — E0500 Thyrotoxicosis with diffuse goiter without thyrotoxic crisis or storm: Secondary | ICD-10-CM

## 2021-05-09 MED ORDER — METHIMAZOLE 10 MG PO TABS: ORAL_TABLET | ORAL | 2 refills | Status: DC

## 2021-05-09 NOTE — Progress Notes (Signed)
05/09/2021     Endocrinology Follow Up Note    Subjective:    Patient ID: Erin Deleon, female    DOB: 12-07-1975, PCP Heather Roberts, NP.   Past Medical History:  Diagnosis Date   Atrial fibrillation [facility])    a. new diagnosis in 09/2020   Cardiomyopathy [facility])    a. 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    Past Surgical History:  Procedure Laterality Date   DILATION AND CURETTAGE OF UTERUS     left leg skin graft Left    right ovary surgery Right    had a mass that was removed surgically; ovary still in place   TONSILLECTOMY      Social History   Socioeconomic History   Marital status: Significant Other    Spouse name: Not on file   Number of children: 1   Years of education: Not on file   Highest education level: Not on file  Occupational History   Occupation: Unemployed  Tobacco Use   Smoking status: Every Day    Packs/day: 0.25    Types: Cigarettes   Smokeless tobacco: Never  Vaping Use   Vaping Use: Never used  Substance and Sexual Activity   Alcohol use: Not Currently    Comment: occ; none since thyroid has been off   Drug use: Never   Sexual activity: Yes    Birth control/protection: None  Other Topics Concern   Not on file  Social History Narrative   Son, 14   Social Determinants of Health   Financial Resource Strain: Not on file  Food Insecurity: Not on file  Transportation Needs: Not on file  Physical Activity: Not on file  Stress: Not on file  Social Connections: Not on file    Family History  Problem Relation Age of Onset   Hypertension Father    Heart attack Father    Thyroid disease Sister    Thyroid disease Brother     Outpatient Encounter Medications as of 05/09/2021  Medication Sig   apixaban (ELIQUIS) 5 MG TABS tablet Take 1 tablet (5 mg total) by mouth 2 (two) times daily.   atenolol (TENORMIN) 25 MG  tablet Take 1.5 tablets (37.5 mg total) by mouth 2 (two) times daily.   digoxin (LANOXIN) 0.25 MG tablet Take 1 tablet (0.25 mg total) by mouth daily.   furosemide (LASIX) 40 MG tablet Take 1 tablet (40 mg total) by mouth daily after breakfast.   magnesium oxide (MAG-OX) 400 (240 Mg) MG tablet Take 1 tablet (400 mg total) by mouth daily.   methimazole (TAPAZOLE) 10 MG tablet Take 2 tablets (20 mg total) by mouth daily with breakfast AND 1 tablet (10 mg total) at bedtime.   potassium chloride SA (KLOR-CON) 20 MEQ tablet Take 1 tablet (20 mEq total) by mouth daily.   predniSONE (DELTASONE) 20 MG tablet Take 1 tablet (20 mg total) by mouth daily with breakfast.   No facility-administered encounter medications on file as of 05/09/2021.    ALLERGIES: No Known Allergies  VACCINATION STATUS: Immunization History  Administered Date(s) Administered   Influenza,inj,Quad PF,6+ Mos 03/06/2021     HPI  Erin Deleon is 46 y.o. female who presents today with a medical history as above. she is being seen in follow up after being seen in consultation for hyperthyroidism requested by Bjorn Pippin  M, NP.  she has been dealing with symptoms of palpitations, diarrhea, tremors, and anxiety for 4-5 months. These symptoms are progressively worsening and troubling to her.  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  She was initiated on Methimazole 20 mg po daily and Atenolol 37.5 mg po BID at that time.  She reports she used to see an endocrinologist back in [place] when she was in college who confirmed she did have Grave's disease.  She did not proceed with definitive therapy at that time due to scheduling conflicts as she was in school.  She had been treated with Methimazole in the past.   she denies dysphagia, choking, shortness of breath, no recent voice change.    she does  have family history of thyroid dysfunction in her sister and grandmother, but denies family hx of thyroid cancer. she denies personal history of goiter.  Denies use of Biotin containing supplements.  she is willing to proceed with appropriate work up and therapy for thyrotoxicosis.   Review of systems  Constitutional: + stable body weight, current Body mass index is 38.47 kg/m., no fatigue, no subjective hyperthermia, no subjective hypothermia Eyes: no blurry vision, no xerophthalmia, vertigo ENT: no sore throat, no nodules palpated in throat, no dysphagia/odynophagia, no hoarseness, throat swelling R>L Cardiovascular: no chest pain, no shortness of breath, + palpitations-improving, + leg swelling Respiratory: no cough, + shortness of breath-improving Gastrointestinal: no nausea/vomiting, + diarrhea Musculoskeletal: no muscle/joint aches Skin: no rashes, no hyperemia Neurological: + tremors-improving, no numbness, no tingling, no dizziness Psychiatric: no depression, + anxiety-improving, + irritability   Objective:    BP 120/83    Pulse (!) 102    Ht 5\' 8"  (1.727 m)    Wt 253 lb (114.8 kg)    BMI 38.47 kg/m   Wt Readings from Last 3 Encounters:  05/09/21 253 lb (114.8 kg)  03/06/21 246 lb 12.8 oz (111.9 kg)  03/04/21 246 lb 3.2 oz (111.7 kg)     BP Readings from Last 3 Encounters:  05/09/21 120/83  03/06/21 (!) 142/90  03/04/21 124/77                        Physical Exam- Limited  Constitutional:  Body mass index is 38.47 kg/m. , not in acute distress, normal state of mind Eyes:  EOMI, mild exophthalmos L>R Neck: Supple Thyroid: + gross goiter R>L Cardiovascular: irregular rhythm-being treated for AFIB, no murmurs, rubs, or gallops, no edema (was recently started on diuretic) Respiratory: Adequate breathing efforts, no crackles, rales, rhonchi, or wheezing Musculoskeletal: no gross deformities, strength intact in all four extremities, no gross restriction of joint  movements Skin:  no rashes, no hyperemia Neurological: slight tremor with outstretched hands, DTR slightly overactive   CMP     Component Value Date/Time   NA 138 12/31/2020 0843   K 4.4 12/31/2020 0843   CL 102 12/31/2020 0843   CO2 22 12/31/2020 0843   GLUCOSE 96 12/31/2020 0843   GLUCOSE 93 10/29/2020 0610   BUN 12 12/31/2020 0843   CREATININE 0.55 (L) 12/31/2020 0843   CALCIUM 9.3 12/31/2020 0843   PROT 6.7 12/31/2020 0843   ALBUMIN 3.8 12/31/2020 0843   AST 16 12/31/2020 0843   ALT 13 12/31/2020 0843   ALKPHOS 187 (H) 12/31/2020 0843   BILITOT 0.6 12/31/2020 0843  GFRNONAA >60 10/29/2020 0610     CBC    Component Value Date/Time   WBC 10.4 12/31/2020 0843   WBC 7.9 10/29/2020 0610   RBC 5.39 (H) 12/31/2020 0843   RBC 4.10 10/29/2020 0610   HGB 13.6 12/31/2020 0843   HCT 42.2 12/31/2020 0843   PLT 301 12/31/2020 0843   MCV 78 (L) 12/31/2020 0843   MCH 25.2 (L) 12/31/2020 0843   MCH 25.9 (L) 10/29/2020 0610   MCHC 32.2 12/31/2020 0843   MCHC 31.6 10/29/2020 0610   RDW 15.7 (H) 12/31/2020 0843   LYMPHSABS 4.3 (H) 12/31/2020 0843   MONOABS 1.2 (H) 10/23/2020 1702   EOSABS 0.2 12/31/2020 0843   BASOSABS 0.0 12/31/2020 0843     Diabetic Labs (most recent): No results found for: HGBA1C  Lipid Panel     Component Value Date/Time   CHOL 142 12/31/2020 0843   TRIG 104 12/31/2020 0843   HDL 50 12/31/2020 0843   LDLCALC 73 12/31/2020 0843   LABVLDL 19 12/31/2020 0843     Lab Results  Component Value Date   TSH <0.005 (L) 05/07/2021   TSH <0.005 (L) 04/23/2021   TSH <0.005 (L) 02/25/2021   TSH <0.005 (L) 12/31/2020   TSH <0.010 (L) 10/27/2020   TSH <0.010 (L) 10/23/2020   TSH <0.010 (L) 08/15/2020   FREET4 2.61 (H) 05/07/2021   FREET4 3.81 (H) 04/23/2021   FREET4 2.94 (H) 02/25/2021   FREET4 3.13 (H) 12/31/2020   FREET4 >5.50 (H) 10/23/2020      Latest Reference Range & Units 08/15/20 18:54 10/23/20 17:02 10/27/20 03:34 12/31/20 08:42 02/25/21  11:36  TSH 0.450 - 4.500 uIU/mL <0.010 (L) <0.010 (L) <0.010 (L) <0.005 (L) <0.005 (L)  Triiodothyronine,Free,Serum 2.0 - 4.4 pg/mL  27.3 (H)  10.6 (H) 9.0 (H)  T4,Free(Direct) 0.82 - 1.77 ng/dL  >3.84 (H)  5.36 (H) 4.68 (H)  (L): Data is abnormally low (H): Data is abnormally high    Latest Reference Range & Units 12/31/20 08:42 02/25/21 11:36 04/23/21 11:16 05/07/21 13:19  TSH 0.450 - 4.500 uIU/mL <0.005 (L) <0.005 (L) <0.005 (L) <0.005 (L)  Triiodothyronine,Free,Serum 2.0 - 4.4 pg/mL 10.6 (H) 9.0 (H) 11.3 (H) 5.7 (H)  T4,Free(Direct) 0.82 - 1.77 ng/dL 0.32 (H) 1.22 (H) 4.82 (H) 2.61 (H)  (L): Data is abnormally low (H): Data is abnormally high   Assessment & Plan:   1) Hyperthyroidism- r/t Graves Disease  she is being seen at a kind request of Heather Roberts, NP.  her history and most recent labs are reviewed, and she was examined clinically. Subjective and objective findings are consistent with thyrotoxicosis likely from primary hyperthyroidism. The potential risks of untreated thyrotoxicosis and the need for definitive therapy have been discussed in detail with her, and she agrees to proceed with diagnostic workup and treatment plan.    Between visits, the patient had follow up blood work which showed worsening of her thyroid function despite being on Methimazole.   Thus her Methimazole was increased to 20 mg in the morning and 10 mg in the evening and she was also started on more aggressive oral steroid regimen of Prednisone 20 mg po daily x 15 days.  -Her thyroid function seems to be responding to this aggressive treatment, values are improving, not yet at a point where tapering off medication is a safe option.   -She can continue her Atenolol 37.5 mg po twice daily for symptom management.  she will return in another 4 weeks to  assess response to antithyroid treatment and possibly begin tapering her off of Methimazole so uptake and scan can be scheduled    -Patient is advised  to maintain close follow up with Heather Roberts, NP for primary care needs.     I spent 20 minutes in the care of the patient today including review of labs from Thyroid Function, CMP, and other relevant labs ; imaging/biopsy records (current and previous including abstractions from other facilities); face-to-face time discussing  her lab results and symptoms, medications doses, her options of short and long term treatment based on the latest standards of care / guidelines;   and documenting the encounter.  Erin Deleon  participated in the discussions, expressed understanding, and voiced agreement with the above plans.  All questions were answered to her satisfaction. she is encouraged to contact clinic should she have any questions or concerns prior to her return visit.   Follow up plan: Return in about 4 weeks (around 06/06/2021) for Thyroid follow up, Previsit labs.   Thank you for involving me in the care of this pleasant patient, and I will continue to update you with her progress.   Ronny Bacon, [facility] [address] [place], [place] [address] Phone: [phone] Fax: [phone]  05/09/2021, 1:12 PM

## 2021-05-23 ENCOUNTER — Ambulatory Visit: Payer: Medicaid Other | Admitting: Nurse Practitioner

## 2021-05-24 ENCOUNTER — Ambulatory Visit: Payer: Self-pay | Admitting: Nurse Practitioner

## 2021-05-27 ENCOUNTER — Ambulatory Visit: Payer: Self-pay | Admitting: Nurse Practitioner

## 2021-06-05 LAB — T3, FREE: T3, Free: 6.4 pg/mL — ABNORMAL HIGH (ref 2.0–4.4)

## 2021-06-05 LAB — TSH: TSH: <0.005 u[IU]/mL — ABNORMAL LOW (ref 0.450–4.500)

## 2021-06-05 LAB — T4, FREE: Free T4: 1.74 ng/dL (ref 0.82–1.77)

## 2021-06-06 ENCOUNTER — Other Ambulatory Visit: Payer: Self-pay

## 2021-06-06 ENCOUNTER — Ambulatory Visit (INDEPENDENT_AMBULATORY_CARE_PROVIDER_SITE_OTHER): Payer: Medicaid Other | Admitting: Nurse Practitioner

## 2021-06-06 ENCOUNTER — Encounter: Payer: Self-pay | Admitting: Nurse Practitioner

## 2021-06-06 VITALS — BP 116/72 | HR 92 | Ht 68.0 in | Wt 258.6 lb

## 2021-06-06 DIAGNOSIS — E0590 Thyrotoxicosis, unspecified without thyrotoxic crisis or storm: Secondary | ICD-10-CM | POA: Diagnosis not present

## 2021-06-06 DIAGNOSIS — E0500 Thyrotoxicosis with diffuse goiter without thyrotoxic crisis or storm: Secondary | ICD-10-CM

## 2021-06-06 MED ORDER — PREDNISONE 5 MG PO TABS: 5.0000 mg | ORAL_TABLET | Freq: Every day | ORAL | 0 refills | Status: DC

## 2021-06-06 NOTE — Progress Notes (Signed)
06/06/2021     Endocrinology Follow Up Note    Subjective:    Patient ID: Erin Deleon, female    DOB: 1976-03-15, PCP Paseda, Baird Kay, [name].   Past Medical History:  Diagnosis Date   Atrial fibrillation [facility])    a. new diagnosis in 09/2020   Cardiomyopathy [facility])    a. 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    Past Surgical History:  Procedure Laterality Date   DILATION AND CURETTAGE OF UTERUS     left leg skin graft Left    right ovary surgery Right    had a mass that was removed surgically; ovary still in place   TONSILLECTOMY      Social History   Socioeconomic History   Marital status: Significant Other    Spouse name: Not on file   Number of children: 1   Years of education: Not on file   Highest education level: Not on file  Occupational History   Occupation: Unemployed  Tobacco Use   Smoking status: Every Day    Packs/day: 0.25    Types: Cigarettes   Smokeless tobacco: Never  Vaping Use   Vaping Use: Never used  Substance and Sexual Activity   Alcohol use: Not Currently    Comment: occ; none since thyroid has been off   Drug use: Never   Sexual activity: Yes    Birth control/protection: None  Other Topics Concern   Not on file  Social History Narrative   Son, 14   Social Determinants of Health   Financial Resource Strain: Not on file  Food Insecurity: Not on file  Transportation Needs: Not on file  Physical Activity: Not on file  Stress: Not on file  Social Connections: Not on file    Family History  Problem Relation Age of Onset   Hypertension Father    Heart attack Father    Thyroid disease Sister    Thyroid disease Brother     Outpatient Encounter Medications as of 06/06/2021  Medication Sig   apixaban (ELIQUIS) 5 MG TABS tablet Take 1 tablet (5 mg total) by mouth 2 (two) times daily.   atenolol (TENORMIN) 25 MG  tablet Take 1.5 tablets (37.5 mg total) by mouth 2 (two) times daily.   digoxin (LANOXIN) 0.25 MG tablet Take 1 tablet (0.25 mg total) by mouth daily.   furosemide (LASIX) 40 MG tablet Take 1 tablet (40 mg total) by mouth daily after breakfast.   magnesium oxide (MAG-OX) 400 (240 Mg) MG tablet Take 1 tablet (400 mg total) by mouth daily.   methimazole (TAPAZOLE) 10 MG tablet Take 2 tablets (20 mg total) by mouth daily with breakfast AND 1 tablet (10 mg total) at bedtime.   potassium chloride SA (KLOR-CON) 20 MEQ tablet Take 1 tablet (20 mEq total) by mouth daily.   predniSONE (DELTASONE) 5 MG tablet Take 1 tablet (5 mg total) by mouth daily with breakfast.   [DISCONTINUED] predniSONE (DELTASONE) 20 MG tablet Take 1 tablet (20 mg total) by mouth daily with breakfast.   No facility-administered encounter medications on file as of 06/06/2021.    ALLERGIES: No Known Allergies  VACCINATION STATUS: Immunization History  Administered Date(s) Administered   Influenza,inj,Quad PF,6+ Mos 03/06/2021     HPI  Erin Deleon is 46 y.o. female who presents today with a medical history as  above. she is being seen in follow up after being seen in consultation for hyperthyroidism requested by [name], [name].  she has been dealing with symptoms of palpitations, diarrhea, tremors, and anxiety for 4-5 months. These symptoms are progressively worsening and troubling to her.  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  She was initiated on Methimazole 20 mg po daily and Atenolol 37.5 mg po BID at that time.  She reports she used to see an endocrinologist back in [place] when she was in college who confirmed she did have Grave's disease.  She did not proceed with definitive therapy at that time due to scheduling conflicts as she was in school.  She had been treated with  Methimazole in the past.   she denies dysphagia, choking, shortness of breath, no recent voice change.    she does have family history of thyroid dysfunction in her sister and grandmother, but denies family hx of thyroid cancer. she denies personal history of goiter.  Denies use of Biotin containing supplements.  she is willing to proceed with appropriate work up and therapy for thyrotoxicosis.   Review of systems  Constitutional: + stable body weight, current Body mass index is 39.32 kg/m., no fatigue, + hot/cold fluctuations, no subjective hypothermia Eyes: no blurry vision, no xerophthalmia, vertigo ENT: no sore throat, no nodules palpated in throat, no dysphagia/odynophagia, no hoarseness, throat swelling R>L Cardiovascular: no chest pain, no shortness of breath, + palpitations-improving, + leg swelling- improved on diuretic Respiratory: no cough, + shortness of breath-improving Gastrointestinal: no nausea/vomiting, + diarrhea-improved Musculoskeletal: no muscle/joint aches Skin: no rashes, no hyperemia Neurological: + tremors-improving, no numbness, no tingling, no dizziness Psychiatric: no depression, + anxiety-improving, + irritability   Objective:    BP 116/72    Pulse 92    Ht  (1.727 m)    Wt 258 lb 9.6 oz (117.3 kg)    SpO2 97%    BMI 39.32 kg/m   Wt Readings from Last 3 Encounters:  06/06/21 258 lb 9.6 oz (117.3 kg)  05/09/21 253 lb (114.8 kg)  03/06/21 246 lb 12.8 oz (111.9 kg)     BP Readings from Last 3 Encounters:  06/06/21 116/72  05/09/21 120/83  03/06/21 (!) 142/90                        Physical Exam- Limited  Constitutional:  Body mass index is 39.32 kg/m. , not in acute distress, normal state of mind Eyes:  EOMI, mild exophthalmos L>R Neck: Supple Thyroid: + gross goiter R>L Cardiovascular: irregular rhythm-being treated for AFIB, no murmurs, rubs, or gallops Respiratory: Adequate breathing efforts, no crackles, rales, rhonchi, or  wheezing Musculoskeletal: no gross deformities, strength intact in all four extremities, no gross restriction of joint movements Skin:  no rashes, no hyperemia Neurological: slight tremor with outstretched hands, DTR slightly overactive   CMP     Component Value Date/Time   NA 138 12/31/2020 0843   K 4.4 12/31/2020 0843   CL 102 12/31/2020 0843   CO2 22 12/31/2020 0843   GLUCOSE 96 12/31/2020 0843   GLUCOSE 93 10/29/2020 0610   BUN 12 12/31/2020 0843   CREATININE 0.55 (L) 12/31/2020 0843   CALCIUM 9.3 12/31/2020 0843   PROT 6.7 12/31/2020 0843   ALBUMIN 3.8 12/31/2020 0843   AST 16  12/31/2020 0843   ALT 13 12/31/2020 0843   ALKPHOS 187 (H) 12/31/2020 0843   BILITOT 0.6 12/31/2020 0843   GFRNONAA >60 10/29/2020 0610     CBC    Component Value Date/Time   WBC 10.4 12/31/2020 0843   WBC 7.9 10/29/2020 0610   RBC 5.39 (H) 12/31/2020 0843   RBC 4.10 10/29/2020 0610   HGB 13.6 12/31/2020 0843   HCT 42.2 12/31/2020 0843   PLT 301 12/31/2020 0843   MCV 78 (L) 12/31/2020 0843   MCH 25.2 (L) 12/31/2020 0843   MCH 25.9 (L) 10/29/2020 0610   MCHC 32.2 12/31/2020 0843   MCHC 31.6 10/29/2020 0610   RDW 15.7 (H) 12/31/2020 0843   LYMPHSABS 4.3 (H) 12/31/2020 0843   MONOABS 1.2 (H) 10/23/2020 1702   EOSABS 0.2 12/31/2020 0843   BASOSABS 0.0 12/31/2020 0843     Diabetic Labs (most recent): No results found for: HGBA1C  Lipid Panel     Component Value Date/Time   CHOL 142 12/31/2020 0843   TRIG 104 12/31/2020 0843   HDL 50 12/31/2020 0843   LDLCALC 73 12/31/2020 0843   LABVLDL 19 12/31/2020 0843     Lab Results  Component Value Date   TSH <0.005 (L) 06/04/2021   TSH <0.005 (L) 05/07/2021   TSH <0.005 (L) 04/23/2021   TSH <0.005 (L) 02/25/2021   TSH <0.005 (L) 12/31/2020   TSH <0.010 (L) 10/27/2020   TSH <0.010 (L) 10/23/2020   TSH <0.010 (L) 08/15/2020   FREET4 1.74 06/04/2021   FREET4 2.61 (H) 05/07/2021   FREET4 3.81 (H) 04/23/2021   FREET4 2.94 (H)  02/25/2021   FREET4 3.13 (H) 12/31/2020   FREET4 >5.50 (H) 10/23/2020      Latest Reference Range & Units 02/25/21 11:36 04/23/21 11:16 05/07/21 13:19 06/04/21 13:58  TSH 0.450 - 4.500 uIU/mL <0.005 (L) <0.005 (L) <0.005 (L) <0.005 (L)  Triiodothyronine,Free,Serum 2.0 - 4.4 pg/mL 9.0 (H) 11.3 (H) 5.7 (H) 6.4 (H)  T4,Free(Direct) 0.82 - 1.77 ng/dL 8.41 (H) 6.60 (H) 6.30 (H) 1.74  (L): Data is abnormally low (H): Data is abnormally high  Assessment & Plan:   1) Hyperthyroidism- r/t Graves Disease  she is being seen at a kind request of Paseda, Baird Kay, [name].  her history and most recent labs are reviewed, and she was examined clinically. Subjective and objective findings are consistent with thyrotoxicosis likely from primary hyperthyroidism. The potential risks of untreated thyrotoxicosis and the need for definitive therapy have been discussed in detail with her, and she agrees to proceed with diagnostic workup and treatment plan.    -Her thyroid function seems to be responding to this aggressive treatment, values are improving, now at a point where we can begin attempting to taper her off the medication once again.  She is advised to lower her Methimazole to 10 mg po twice daily and will lower Prednisone to 5 mg po daily x 10 days (to avoid inadvertent adrenal problems).   -She can continue her Atenolol 37.5 mg po twice daily for symptom management.  she will return in another 4 weeks to assess response to antithyroid treatment and possibly continue tapering her off of Methimazole so uptake and scan can be scheduled    -Patient is advised to maintain close follow up with [name], [name] for primary care needs.    I spent 20 minutes in the care of the patient today including review of labs from Thyroid Function, CMP, and other relevant labs ;  imaging/biopsy records (current and previous including abstractions from other facilities); face-to-face time discussing  her lab  results and symptoms, medications doses, her options of short and long term treatment based on the latest standards of care / guidelines;   and documenting the encounter.  Erin Deleon  participated in the discussions, expressed understanding, and voiced agreement with the above plans.  All questions were answered to her satisfaction. she is encouraged to contact clinic should she have any questions or concerns prior to her return visit.   Follow up plan: Return in about 4 weeks (around 07/04/2021) for Thyroid follow up, Previsit labs.   Thank you for involving me in the care of this pleasant patient, and I will continue to update you with her progress.   Ronny Bacon, [facility] [address] [place], [place] [address] Phone: [phone] Fax: [phone]  06/06/2021, 10:29 AM

## 2021-06-21 ENCOUNTER — Other Ambulatory Visit: Payer: Self-pay | Admitting: Student

## 2021-06-21 ENCOUNTER — Ambulatory Visit: Payer: Medicaid Other | Admitting: Cardiology

## 2021-06-21 ENCOUNTER — Encounter: Payer: Self-pay | Admitting: Cardiology

## 2021-06-21 VITALS — BP 122/68 | HR 82 | Ht 67.0 in | Wt 261.2 lb

## 2021-06-21 DIAGNOSIS — I5022 Chronic systolic (congestive) heart failure: Secondary | ICD-10-CM | POA: Diagnosis not present

## 2021-06-21 DIAGNOSIS — I4811 Longstanding persistent atrial fibrillation: Secondary | ICD-10-CM

## 2021-06-21 NOTE — Addendum Note (Signed)
Addended by: Lesle Chris on: 06/21/2021 04:03 PM ? ? Modules accepted: Orders ? ?

## 2021-06-21 NOTE — Patient Instructions (Signed)

## 2021-06-21 NOTE — Progress Notes (Signed)
? ? ? ?Clinical Summary ?Erin Deleon is a 46 y.o.female seen today for follow up of the following medical problems.  ? ?1.Afib ?- admit 09/2020 with new diagnosis of afib, presented with RVR ?- occurred in setting of severe hyperthyroidism ? ?- no recent palpitations ?- compliant with meds ?- no bleeding on eliquis.  ? ?2.Chronic systolic HF ?- 0000000 echo LVEF 40-45% though limited visualization, would get contrast limited study once rates controlled. Enlarged RV with low normal function ? ?- takes lasix 40 prn. Edema overall controlled ? ?3.OSA screen ?- some snoring at night, +hypersomnolence.  ? ?4. Hyperthyroidism ?- most recent TSH <0.005, free T4 1.74, free T3 6.4 ?Past Medical History:  ?Diagnosis Date  ? Atrial fibrillation ([place])   ? a. new diagnosis in 09/2020  ? Cardiomyopathy ([place])   ? a. EF at 40-45% by echo in 09/2020  ? COPD (chronic obstructive pulmonary disease) ([place])   ? Depression   ? Thyroid disease   ? 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 ? ? ?No Known Allergies ? ? ?Current Outpatient Medications  ?Medication Sig Dispense Refill  ? apixaban (ELIQUIS) 5 MG TABS tablet Take 1 tablet (5 mg total) by mouth 2 (two) times daily. 60 tablet 5  ? atenolol (TENORMIN) 25 MG tablet Take 1.5 tablets (37.5 mg total) by mouth 2 (two) times daily. 90 tablet 5  ? digoxin (LANOXIN) 0.25 MG tablet Take 1 tablet (0.25 mg total) by mouth daily. 30 tablet 5  ? furosemide (LASIX) 40 MG tablet Take 1 tablet (40 mg total) by mouth daily after breakfast. 30 tablet 11  ? magnesium oxide (MAG-OX) 400 (240 Mg) MG tablet Take 1 tablet (400 mg total) by mouth daily. 30 tablet 5  ? methimazole (TAPAZOLE) 10 MG tablet Take 2 tablets (20 mg total) by mouth daily with breakfast AND 1 tablet (10 mg total) at bedtime. 60 tablet 2  ? potassium chloride SA (KLOR-CON) 20 MEQ tablet Take 1 tablet (20 mEq total) by mouth daily. 30 tablet 5  ? predniSONE (DELTASONE) 5 MG tablet Take 1 tablet (5 mg total) by mouth daily with breakfast. 10 tablet  0  ? ?No current facility-administered medications for this visit.  ? ? ? ?Past Surgical History:  ?Procedure Laterality Date  ? DILATION AND CURETTAGE OF UTERUS    ? left leg skin graft Left   ? right ovary surgery Right   ? had a mass that was removed surgically; ovary still in place  ? TONSILLECTOMY    ? ? ? ?No Known Allergies ? ? ? ?Family History  ?Problem Relation Age of Onset  ? Hypertension Father   ? Heart attack Father   ? Thyroid disease Sister   ? Thyroid disease Brother   ? ? ? ?Social History ?Erin Deleon reports that she has been smoking cigarettes. She has been smoking an average of .25 packs per day. She has never used smokeless tobacco. ?Erin Deleon reports that she does not currently use alcohol. ? ? ?Review of Systems ?CONSTITUTIONAL: No weight loss, fever, chills, weakness or fatigue.  ?HEENT: Eyes: No visual loss, blurred vision, double vision or yellow sclerae.No hearing loss, sneezing, congestion, runny nose or sore throat.  ?SKIN: No rash or itching.  ?CARDIOVASCULAR: per hpi ?RESPIRATORY: No shortness of breath, cough or sputum.  ?GASTROINTESTINAL: No anorexia, nausea, vomiting or diarrhea. No abdominal pain or blood.  ?GENITOURINARY: No burning on urination, no polyuria ?NEUROLOGICAL: No headache, dizziness, syncope, paralysis, ataxia, numbness or tingling in the  extremities. No change in bowel or bladder control.  ?MUSCULOSKELETAL: No muscle, back pain, joint pain or stiffness.  ?LYMPHATICS: No enlarged nodes. No history of splenectomy.  ?PSYCHIATRIC: No history of depression or anxiety.  ?ENDOCRINOLOGIC: No reports of sweating, cold or heat intolerance. No polyuria or polydipsia.  ?. ? ? ?Physical Examination ?Today's Vitals  ? 06/21/21 1527  ?BP: 122/68  ?Pulse: 82  ?SpO2: 97%  ?Weight: 261 lb 3.2 oz (118.5 kg)  ?Height: 5\' 7"  (1.702 m)  ? ?Body mass index is 40.91 kg/m?. ? ?Gen: resting comfortably, no acute distress ?HEENT: no scleral icterus, pupils equal round and reactive, no  palptable cervical adenopathy,  ?CV: irreg, no m/r/g,no jvd ?Resp: Clear to auscultation bilaterally ?GI: abdomen is soft, non-tender, non-distended, normal bowel sounds, no hepatosplenomegaly ?MSK: extremities are warm, no edema.  ?Skin: warm, no rash ?Neuro:  no focal deficits ?Psych: appropriate affect ? ? ? ? ?Assessment and Plan  ?1.Persistent afib/acquired thrombophilia ?- no recent symptoms, she is rate controlled ?- consider DCCV once hyperthyroid is controlled, high risk of recurrence of afib if done now ?- continue current meds including eliquis for stroke prevention.  ? ?2. Chronic systolic HF ?- repeat echo, If ongoing dyfunction will need medication adjustment ? ? ? ? ?Arnoldo Lenis, M.D. ?

## 2021-06-29 ENCOUNTER — Other Ambulatory Visit: Payer: Self-pay | Admitting: Nurse Practitioner

## 2021-06-29 LAB — T3, FREE: T3, Free: 5.6 pg/mL — ABNORMAL HIGH (ref 2.0–4.4)

## 2021-06-29 LAB — TSH: TSH: <0.005 u[IU]/mL — ABNORMAL LOW (ref 0.450–4.500)

## 2021-06-29 LAB — T4, FREE: Free T4: 1.76 ng/dL (ref 0.82–1.77)

## 2021-07-04 ENCOUNTER — Encounter: Payer: Self-pay | Admitting: Nurse Practitioner

## 2021-07-04 ENCOUNTER — Ambulatory Visit: Payer: Medicaid Other | Admitting: Nurse Practitioner

## 2021-07-04 VITALS — BP 111/73 | HR 91 | Ht 67.0 in | Wt 265.0 lb

## 2021-07-04 DIAGNOSIS — E0500 Thyrotoxicosis with diffuse goiter without thyrotoxic crisis or storm: Secondary | ICD-10-CM

## 2021-07-04 DIAGNOSIS — E0590 Thyrotoxicosis, unspecified without thyrotoxic crisis or storm: Secondary | ICD-10-CM | POA: Diagnosis not present

## 2021-07-04 MED ORDER — METHIMAZOLE 10 MG PO TABS: 10.0000 mg | ORAL_TABLET | Freq: Every day | ORAL | 0 refills | Status: DC

## 2021-07-04 NOTE — Progress Notes (Signed)
? ? ? 07/04/2021   ? ? ?Endocrinology Follow Up Note  ? ? ?Subjective:  ? ? Patient ID: Erin Deleon, female    DOB: April 18, 1975, PCP Paseda, Baird Kay, [name]. ? ? ?Past Medical History:  ?Diagnosis Date  ? Atrial fibrillation ([facility])   ? a. new diagnosis in 09/2020  ? Cardiomyopathy ([facility])   ? a. EF at 40-45% by echo in 09/2020  ? COPD (chronic obstructive pulmonary disease) ([facility])   ? Depression   ? Thyroid disease   ? 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 ? ?Past Surgical History:  ?Procedure Laterality Date  ? DILATION AND CURETTAGE OF UTERUS    ? left leg skin graft Left   ? right ovary surgery Right   ? had a mass that was removed surgically; ovary still in place  ? TONSILLECTOMY    ? ? ?Social History  ? ?Socioeconomic History  ? Marital status: Significant Other  ?  Spouse name: Not on file  ? Number of children: 1  ? Years of education: Not on file  ? Highest education level: Not on file  ?Occupational History  ? Occupation: Unemployed  ?Tobacco Use  ? Smoking status: Every Day  ?  Packs/day: 0.25  ?  Types: Cigarettes  ? Smokeless tobacco: Never  ?Vaping Use  ? Vaping Use: Never used  ?Substance and Sexual Activity  ? Alcohol use: Not Currently  ?  Comment: occ; none since thyroid has been off  ? Drug use: Never  ? Sexual activity: Yes  ?  Birth control/protection: None  ?Other Topics Concern  ? Not on file  ?Social History Narrative  ? Son, 14  ? ?Social Determinants of Health  ? ?Financial Resource Strain: Not on file  ?Food Insecurity: Not on file  ?Transportation Needs: Not on file  ?Physical Activity: Not on file  ?Stress: Not on file  ?Social Connections: Not on file  ? ? ?Family History  ?Problem Relation Age of Onset  ? Hypertension Father   ? Heart attack Father   ? Thyroid disease Sister   ? Thyroid disease Brother   ? ? ?Outpatient Encounter Medications as of 07/04/2021  ?Medication Sig  ? apixaban (ELIQUIS) 5 MG TABS tablet Take 1 tablet (5 mg total) by mouth 2 (two) times daily.  ? atenolol (TENORMIN) 25 MG  tablet Take 1 tablet (25 mg total) by mouth in the morning AND 2 tablets (50 mg total) every evening.  ? digoxin (LANOXIN) 0.25 MG tablet Take 1 tablet (0.25 mg total) by mouth daily.  ? furosemide (LASIX) 40 MG tablet Take 1 tablet (40 mg total) by mouth daily after breakfast.  ? magnesium oxide (MAG-OX) 400 (240 Mg) MG tablet Take 1 tablet (400 mg total) by mouth daily.  ? potassium chloride SA (KLOR-CON) 20 MEQ tablet Take 1 tablet (20 mEq total) by mouth daily.  ? [DISCONTINUED] methimazole (TAPAZOLE) 10 MG tablet Take 1 tablet (10 mg total) by mouth 2 (two) times daily.  ? methimazole (TAPAZOLE) 10 MG tablet Take 1 tablet (10 mg total) by mouth daily.  ? ?No facility-administered encounter medications on file as of 07/04/2021.  ? ? ?ALLERGIES: ?No Known Allergies ? ?VACCINATION STATUS: ?Immunization History  ?Administered Date(s) Administered  ? Influenza,inj,Quad PF,6+ Mos 03/06/2021  ? ? ? ?HPI ? ?Erin Deleon is 46 y.o. female who presents today with a medical history as above. she is being seen in follow up after being seen in consultation for hyperthyroidism requested by [name],  [name].  she has been dealing with symptoms of palpitations, diarrhea, tremors, and anxiety for 4-5 months. These symptoms are progressively worsening and troubling to her.  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  She was initiated on Methimazole 20 mg po daily and Atenolol 37.5 mg po BID at that time. ? ?She reports she used to see an endocrinologist back in [place] when she was in college who confirmed she did have Grave's disease.  She did not proceed with definitive therapy at that time due to scheduling conflicts as she was in school.  She had been treated with Methimazole in the past.  ? ?she denies dysphagia, choking, shortness of breath, no recent voice change.  ?  ?she does  have family history of thyroid dysfunction in her sister and grandmother, but denies family hx of thyroid cancer. she denies personal history of goiter.  Denies use of Biotin containing supplements.  she is willing to proceed with appropriate work up and therapy for thyrotoxicosis. ? ? ?Review of systems ? ?Constitutional: + weight gain-has cut back on fluid pills, current Body mass index is 41.5 kg/m?., no fatigue, + hot/cold fluctuations, no subjective hypothermia ?Eyes: no blurry vision, no xerophthalmia, vertigo ?ENT: no sore throat, no nodules palpated in throat, no dysphagia/odynophagia, no hoarseness, throat swelling R>L ?Cardiovascular: no chest pain, no shortness of breath, + palpitations-improving, + leg swelling- improved on diuretic ?Respiratory: no cough, + shortness of breath-improving ?Gastrointestinal: no nausea/vomiting, + diarrhea-improved ?Musculoskeletal: no muscle/joint aches ?Skin: no rashes, no hyperemia ?Neurological: + tremors-improving, no numbness, no tingling, no dizziness ?Psychiatric: no depression, + anxiety-improving, + irritability ? ? ?Objective:  ?  ?BP 111/73   Pulse 91   Ht 5\' 7"  (1.702 m)   Wt 265 lb (120.2 kg)   SpO2 97%   BMI 41.50 kg/m?   ?Wt Readings from Last 3 Encounters:  ?07/04/21 265 lb (120.2 kg)  ?06/21/21 261 lb 3.2 oz (118.5 kg)  ?06/06/21 258 lb 9.6 oz (117.3 kg)  ?  ? ?BP Readings from Last 3 Encounters:  ?07/04/21 111/73  ?06/21/21 122/68  ?06/06/21 116/72  ? ?                    ? ?Physical Exam- Limited ? ?Constitutional:  Body mass index is 41.5 kg/m?. , not in acute distress, normal state of mind ?Eyes:  EOMI, mild exophthalmos L>R ?Neck: Supple ?Thyroid: + gross goiter R>L ?Cardiovascular: irregular rhythm-being treated for AFIB, no murmurs, rubs, or gallops ?Respiratory: Adequate breathing efforts, no crackles, rales, rhonchi, or wheezing ?Musculoskeletal: no gross deformities, strength intact in all four extremities, no gross restriction of joint  movements ?Skin:  no rashes, no hyperemia ?Neurological: slight tremor with outstretched hands, DTR slightly overactive ? ? ?CMP  ?   ?Component Value Date/Time  ? NA 138 12/31/2020 0843  ? K 4.4 12/31/2020 0843  ? CL 102 12/31/2020 0843  ? CO2 22 12/31/2020 0843  ? GLUCOSE 96 12/31/2020 0843  ? GLUCOSE 93 10/29/2020 0610  ? BUN 12 12/31/2020 0843  ? CREATININE 0.55 (L) 12/31/2020 0843  ? CALCIUM 9.3 12/31/2020 0843  ? PROT 6.7 12/31/2020 0843  ? ALBUMIN 3.8 12/31/2020 0843  ? AST 16 12/31/2020 0843  ? ALT 13 12/31/2020 0843  ? ALKPHOS 187 (H) 12/31/2020 0843  ? BILITOT 0.6 12/31/2020 0843  ? GFRNONAA >  60 10/29/2020 0610  ? ? ? ?CBC ?   ?Component Value Date/Time  ? WBC 10.4 12/31/2020 0843  ? WBC 7.9 10/29/2020 0610  ? RBC 5.39 (H) 12/31/2020 0843  ? RBC 4.10 10/29/2020 0610  ? HGB 13.6 12/31/2020 0843  ? HCT 42.2 12/31/2020 0843  ? PLT 301 12/31/2020 0843  ? MCV 78 (L) 12/31/2020 0843  ? MCH 25.2 (L) 12/31/2020 0843  ? MCH 25.9 (L) 10/29/2020 0610  ? MCHC 32.2 12/31/2020 0843  ? MCHC 31.6 10/29/2020 0610  ? RDW 15.7 (H) 12/31/2020 0843  ? LYMPHSABS 4.3 (H) 12/31/2020 0843  ? MONOABS 1.2 (H) 10/23/2020 1702  ? EOSABS 0.2 12/31/2020 0843  ? BASOSABS 0.0 12/31/2020 0843  ? ? ? ?Diabetic Labs (most recent): ?No results found for: HGBA1C ? ?Lipid Panel  ?   ?Component Value Date/Time  ? CHOL 142 12/31/2020 0843  ? TRIG 104 12/31/2020 0843  ? HDL 50 12/31/2020 0843  ? LDLCALC 73 12/31/2020 0843  ? LABVLDL 19 12/31/2020 0843  ? ? ? ?Lab Results  ?Component Value Date  ? TSH <0.005 (L) 06/28/2021  ? TSH <0.005 (L) 06/04/2021  ? TSH <0.005 (L) 05/07/2021  ? TSH <0.005 (L) 04/23/2021  ? TSH <0.005 (L) 02/25/2021  ? TSH <0.005 (L) 12/31/2020  ? TSH <0.010 (L) 10/27/2020  ? TSH <0.010 (L) 10/23/2020  ? TSH <0.010 (L) 08/15/2020  ? FREET4 1.76 06/28/2021  ? FREET4 1.74 06/04/2021  ? FREET4 2.61 (H) 05/07/2021  ? FREET4 3.81 (H) 04/23/2021  ? FREET4 2.94 (H) 02/25/2021  ? FREET4 3.13 (H) 12/31/2020  ? FREET4 >5.50 (H)  10/23/2020  ?  ? ? Latest Reference Range & Units 04/23/21 11:16 05/07/21 13:19 06/04/21 13:58 06/28/21 08:42  ?TSH 0.450 - 4.500 uIU/mL <0.005 (L) <0.005 (L) <0.005 (L) <0.005 (L)  ?Triiodothyronine,Free,Serum 2.0 - 4.4

## 2021-07-08 ENCOUNTER — Telehealth: Payer: Self-pay

## 2021-07-08 MED ORDER — DIGOXIN 250 MCG PO TABS: 0.2500 mg | ORAL_TABLET | Freq: Every day | ORAL | 5 refills | Status: DC

## 2021-07-08 NOTE — Telephone Encounter (Signed)
Medication refill request for Digoxin 0.25 mg tablets approved and sent to [facility].  ?

## 2021-07-10 ENCOUNTER — Ambulatory Visit (HOSPITAL_COMMUNITY)
Admission: RE | Admit: 2021-07-10 | Discharge: 2021-07-10 | Disposition: A | Discharge status: Home / Self Care | Payer: Medicaid Other | Source: Ambulatory Visit | Attending: Cardiology | Admitting: Cardiology

## 2021-07-10 DIAGNOSIS — I5022 Chronic systolic (congestive) heart failure: Secondary | ICD-10-CM | POA: Diagnosis present

## 2021-07-10 LAB — ECHOCARDIOGRAM COMPLETE
Area-P 1/2: 3.85 cm2
S' Lateral: 4 cm

## 2021-07-10 NOTE — Progress Notes (Signed)
*  PRELIMINARY RESULTS* ?Echocardiogram ?2D Echocardiogram has been performed. ? ?Stacey Drain ?07/10/2021, 11:06 AM ?

## 2021-07-11 ENCOUNTER — Telehealth: Payer: Self-pay | Admitting: *Deleted

## 2021-07-11 ENCOUNTER — Encounter: Payer: Self-pay | Admitting: *Deleted

## 2021-07-11 NOTE — Telephone Encounter (Signed)
Laurine Blazer, LPN  ?624THL  624THL AM EDT Back to Top  ?  ?Notified via my chart.  Copy to pcp.   ? ?

## 2021-07-11 NOTE — Telephone Encounter (Signed)
-----   Message from Antoine Poche, MD sent at 07/10/2021  3:22 PM EDT ----- ?Echo looks good, heart pumping function has improved and now is in the normal range.  ? ?Dominga Ferry MD ?

## 2021-07-16 ENCOUNTER — Other Ambulatory Visit: Payer: Self-pay | Admitting: Student

## 2021-07-25 ENCOUNTER — Other Ambulatory Visit: Payer: Self-pay | Admitting: Student

## 2021-07-25 NOTE — Telephone Encounter (Signed)
Prescription refill request for Eliquis received. Indication:  Atrial Fib Last office visit: 06/21/21  Dominga Ferry MD Scr: 0.55 on 12/31/20 Age: 46 Weight: 118.5kg  Based on above findings Eliquis 5mg  twice daily is the appropriate dose.  Refill approved.

## 2021-08-06 ENCOUNTER — Other Ambulatory Visit: Payer: Self-pay | Admitting: Student

## 2021-08-13 LAB — T4, FREE: Free T4: 4.89 ng/dL — ABNORMAL HIGH (ref 0.82–1.77)

## 2021-08-13 LAB — TSH: TSH: <0.005 u[IU]/mL — ABNORMAL LOW (ref 0.450–4.500)

## 2021-08-13 LAB — T3, FREE: T3, Free: 13.4 pg/mL — ABNORMAL HIGH (ref 2.0–4.4)

## 2021-08-15 ENCOUNTER — Ambulatory Visit (INDEPENDENT_AMBULATORY_CARE_PROVIDER_SITE_OTHER): Payer: Medicaid Other | Admitting: Nurse Practitioner

## 2021-08-15 ENCOUNTER — Encounter: Payer: Self-pay | Admitting: Nurse Practitioner

## 2021-08-15 VITALS — BP 118/62 | HR 84 | Ht 67.0 in | Wt 272.0 lb

## 2021-08-15 DIAGNOSIS — E0590 Thyrotoxicosis, unspecified without thyrotoxic crisis or storm: Secondary | ICD-10-CM | POA: Diagnosis not present

## 2021-08-15 DIAGNOSIS — E0500 Thyrotoxicosis with diffuse goiter without thyrotoxic crisis or storm: Secondary | ICD-10-CM | POA: Diagnosis not present

## 2021-08-15 MED ORDER — METHIMAZOLE 10 MG PO TABS
20.0000 mg | ORAL_TABLET | Freq: Three times a day (TID) | ORAL | 3 refills | Status: AC
Start: 2021-08-15 — End: ?

## 2021-08-15 MED ORDER — PREDNISONE 10 MG PO TABS: 10.0000 mg | ORAL_TABLET | Freq: Every day | ORAL | 0 refills | Status: AC

## 2021-08-15 NOTE — Progress Notes (Signed)
08/15/2021     Endocrinology Follow Up Note    Subjective:    Patient ID: Erin Deleon, female    DOB: 16-Jan-1976, PCP Paseda, Baird Kay, [name].   Past Medical History:  Diagnosis Date   Atrial fibrillation [facility])    a. new diagnosis in 09/2020   Cardiomyopathy [facility])    a. 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    Past Surgical History:  Procedure Laterality Date   DILATION AND CURETTAGE OF UTERUS     left leg skin graft Left    right ovary surgery Right    had a mass that was removed surgically; ovary still in place   TONSILLECTOMY      Social History   Socioeconomic History   Marital status: Significant Other    Spouse name: Not on file   Number of children: 1   Years of education: Not on file   Highest education level: Not on file  Occupational History   Occupation: Unemployed  Tobacco Use   Smoking status: Every Day    Packs/day: 0.25    Types: Cigarettes   Smokeless tobacco: Never  Vaping Use   Vaping Use: Never used  Substance and Sexual Activity   Alcohol use: Not Currently    Comment: occ; none since thyroid has been off   Drug use: Never   Sexual activity: Yes    Birth control/protection: None  Other Topics Concern   Not on file  Social History Narrative   Son, 14   Social Determinants of Health   Financial Resource Strain: Not on file  Food Insecurity: Not on file  Transportation Needs: Not on file  Physical Activity: Not on file  Stress: Not on file  Social Connections: Not on file    Family History  Problem Relation Age of Onset   Hypertension Father    Heart attack Father    Thyroid disease Sister    Thyroid disease Brother     Outpatient Encounter Medications as of 08/15/2021  Medication Sig   predniSONE (DELTASONE) 10 MG tablet Take 1 tablet (10 mg total) by mouth daily with breakfast.   atenolol (TENORMIN)  25 MG tablet Take 1 tablet (25 mg total) by mouth in the morning AND 2 tablets (50 mg total) every evening.   digoxin (LANOXIN) 0.25 MG tablet TAKE 1 TABLET(0.25 MG) BY MOUTH DAILY   ELIQUIS 5 MG TABS tablet TAKE 1 TABLET(5 MG) BY MOUTH TWICE DAILY   furosemide (LASIX) 40 MG tablet Take 1 tablet (40 mg total) by mouth daily after breakfast.   magnesium oxide (MAG-OX) 400 (240 Mg) MG tablet Take 1 tablet (400 mg total) by mouth daily.   methimazole (TAPAZOLE) 10 MG tablet Take 2 tablets (20 mg total) by mouth 3 (three) times daily.   potassium chloride SA (KLOR-CON M) 20 MEQ tablet TAKE 1 TABLET(20 MEQ) BY MOUTH DAILY   [DISCONTINUED] methimazole (TAPAZOLE) 10 MG tablet Take 1 tablet (10 mg total) by mouth daily.   No facility-administered encounter medications on file as of 08/15/2021.    ALLERGIES: No Known Allergies  VACCINATION STATUS: Immunization History  Administered Date(s) Administered   Influenza,inj,Quad PF,6+ Mos 03/06/2021     HPI  Erin Deleon is 46 y.o. female who presents today with a medical history as above. she is being seen in follow up after being  seen in consultation for hyperthyroidism requested by [name], [name].  she has been dealing with symptoms of palpitations, diarrhea, tremors, and anxiety for 4-5 months. These symptoms are progressively worsening and troubling to her.  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  She was initiated on Methimazole 20 mg po daily and Atenolol 37.5 mg po BID at that time.  She reports she used to see an endocrinologist back in [place] when she was in college who confirmed she did have Grave's disease.  She did not proceed with definitive therapy at that time due to scheduling conflicts as she was in school.  She had been treated with Methimazole in the past.   she denies dysphagia,  choking, shortness of breath, no recent voice change.    she does have family history of thyroid dysfunction in her sister and grandmother, but denies family hx of thyroid cancer. she denies personal history of goiter.  Denies use of Biotin containing supplements.  she is willing to proceed with appropriate work up and therapy for thyrotoxicosis.   Review of systems  Constitutional: + weight gain-has cut back on fluid pills, current Body mass index is 42.6 kg/m., no fatigue, + hot/cold fluctuations, no subjective hypothermia Eyes: no blurry vision, no xerophthalmia, vertigo ENT: no sore throat, no nodules palpated in throat, no dysphagia/odynophagia, no hoarseness, throat swelling R>L Cardiovascular: no chest pain, no shortness of breath, + palpitations-improving, + leg swelling- improved on diuretic Respiratory: no cough, + shortness of breath-improving Gastrointestinal: no nausea/vomiting, + diarrhea-improved Musculoskeletal: no muscle/joint aches Skin: no rashes, no hyperemia Neurological: + tremors-improving, no numbness, no tingling, no dizziness Psychiatric: no depression, + anxiety-improving, + irritability   Objective:    BP 118/62   Pulse 84   Ht 5\' 7"  (1.702 m)   Wt 272 lb (123.4 kg)   SpO2 98%   BMI 42.60 kg/m   Wt Readings from Last 3 Encounters:  08/15/21 272 lb (123.4 kg)  07/04/21 265 lb (120.2 kg)  06/21/21 261 lb 3.2 oz (118.5 kg)     BP Readings from Last 3 Encounters:  08/15/21 118/62  07/04/21 111/73  06/21/21 122/68                        Physical Exam- Limited  Constitutional:  Body mass index is 42.6 kg/m. , not in acute distress, normal state of mind Eyes:  EOMI, mild exophthalmos L>R Neck: Supple Thyroid: + gross goiter R>L Cardiovascular: irregular rhythm-being treated for AFIB, no murmurs, rubs, or gallops Respiratory: Adequate breathing efforts, no crackles, rales, rhonchi, or wheezing Musculoskeletal: no gross deformities, strength  intact in all four extremities, no gross restriction of joint movements Skin:  no rashes, no hyperemia Neurological: slight tremor with outstretched hands, DTR slightly overactive   CMP     Component Value Date/Time   NA 138 12/31/2020 0843   K 4.4 12/31/2020 0843   CL 102 12/31/2020 0843   CO2 22 12/31/2020 0843   GLUCOSE 96 12/31/2020 0843   GLUCOSE 93 10/29/2020 0610   BUN 12 12/31/2020 0843   CREATININE 0.55 (L) 12/31/2020 0843   CALCIUM 9.3 12/31/2020 0843   PROT 6.7 12/31/2020 0843   ALBUMIN 3.8 12/31/2020 0843   AST 16 12/31/2020 0843   ALT 13 12/31/2020 0843   ALKPHOS 187 (H) 12/31/2020 0867  BILITOT 0.6 12/31/2020 0843   GFRNONAA >60 10/29/2020 0610     CBC    Component Value Date/Time   WBC 10.4 12/31/2020 0843   WBC 7.9 10/29/2020 0610   RBC 5.39 (H) 12/31/2020 0843   RBC 4.10 10/29/2020 0610   HGB 13.6 12/31/2020 0843   HCT 42.2 12/31/2020 0843   PLT 301 12/31/2020 0843   MCV 78 (L) 12/31/2020 0843   MCH 25.2 (L) 12/31/2020 0843   MCH 25.9 (L) 10/29/2020 0610   MCHC 32.2 12/31/2020 0843   MCHC 31.6 10/29/2020 0610   RDW 15.7 (H) 12/31/2020 0843   LYMPHSABS 4.3 (H) 12/31/2020 0843   MONOABS 1.2 (H) 10/23/2020 1702   EOSABS 0.2 12/31/2020 0843   BASOSABS 0.0 12/31/2020 0843     Diabetic Labs (most recent): No results found for: HGBA1C  Lipid Panel     Component Value Date/Time   CHOL 142 12/31/2020 0843   TRIG 104 12/31/2020 0843   HDL 50 12/31/2020 0843   LDLCALC 73 12/31/2020 0843   LABVLDL 19 12/31/2020 0843     Lab Results  Component Value Date   TSH <0.005 (L) 08/12/2021   TSH <0.005 (L) 06/28/2021   TSH <0.005 (L) 06/04/2021   TSH <0.005 (L) 05/07/2021   TSH <0.005 (L) 04/23/2021   TSH <0.005 (L) 02/25/2021   TSH <0.005 (L) 12/31/2020   TSH <0.010 (L) 10/27/2020   TSH <0.010 (L) 10/23/2020   TSH <0.010 (L) 08/15/2020   FREET4 4.89 (H) 08/12/2021   FREET4 1.76 06/28/2021   FREET4 1.74 06/04/2021   FREET4 2.61 (H)  05/07/2021   FREET4 3.81 (H) 04/23/2021   FREET4 2.94 (H) 02/25/2021   FREET4 3.13 (H) 12/31/2020   FREET4 >5.50 (H) 10/23/2020      Latest Reference Range & Units 04/23/21 11:16 05/07/21 13:19 06/04/21 13:58 06/28/21 08:42 08/12/21 11:28  TSH 0.450 - 4.500 uIU/mL <0.005 (L) <0.005 (L) <0.005 (L) <0.005 (L) <0.005 (L)  Triiodothyronine,Free,Serum 2.0 - 4.4 pg/mL 11.3 (H) 5.7 (H) 6.4 (H) 5.6 (H) 13.4 (H)  T4,Free(Direct) 0.82 - 1.77 ng/dL 1.61 (H) 0.96 (H) 0.45 1.76 4.89 (H)  (L): Data is abnormally low (H): Data is abnormally high  Assessment & Plan:   1) Hyperthyroidism- r/t Graves Disease  she is being seen at a kind request of Paseda, Baird Kay, [name].  her history and most recent labs are reviewed, and she was examined clinically. Subjective and objective findings are consistent with thyrotoxicosis likely from primary hyperthyroidism. The potential risks of untreated thyrotoxicosis and the need for definitive therapy have been discussed in detail with her, and she agrees to proceed with diagnostic workup and treatment plan.    -Repeat thyroid function tests show worsening of her hyperthyroidism after cutting back on her Methimazole.  I have discussed her case with Dr. Fransico Him, and given her complications from hyperthyroidism, we are doing more research on treatment options that will cause her the least amount of harm in the long-run.  For the time being, she is advised to increase her Methimazole to 20 mg po twice daily and start another course of steroids with Prednisone 10 mg po daily x 15 days.   I did reach out to Dr. Tyron Russell, the radiologist at [facility] to ask for his expertise on this matter as well.  I am hoping that given her circumstances, we may be able to perform the ablation therapy without having to first have the uptake and scan (knowing that she will need to  be off Methimazole for at least 5 days prior to the procedure).  The other option would be surgical removal of the thyroid  which we would like to avoid given her CHF.    In the interim, I did order for her to have ultrasound of her thyroid as baseline and added antibody testing with her next set of labs to help classify her thyroid dysfunction since records from her previous endocrinologist are unavailable.   -She is advised to continue her Atenolol 37.5 mg po twice daily for symptom management.     -Patient is advised to maintain close follow up with [name], [name] for primary care needs.   I spent 32 minutes in the care of the patient today including review of labs from Thyroid Function, CMP, and other relevant labs ; imaging/biopsy records (current and previous including abstractions from other facilities); face-to-face time discussing  her lab results and symptoms, medications doses, her options of short and long term treatment based on the latest standards of care / guidelines;   and documenting the encounter.  Erin Deleon  participated in the discussions, expressed understanding, and voiced agreement with the above plans.  All questions were answered to her satisfaction. she is encouraged to contact clinic should she have any questions or concerns prior to her return visit.   Follow up plan: Return in about 4 weeks (around 09/12/2021) for Thyroid follow up, Previsit labs, thyroid ultrasound.   Thank you for involving me in the care of this pleasant patient, and I will continue to update you with her progress.   Ronny Bacon, [facility] [address] [place], [place] [address] Phone: [phone] Fax: [phone]  08/15/2021, 9:46 AM

## 2021-09-09 ENCOUNTER — Telehealth: Payer: Self-pay | Admitting: Nurse Practitioner

## 2021-09-09 NOTE — Telephone Encounter (Signed)
Sent this order back to scheduling. Will follow up to see if scheduled.

## 2021-09-09 NOTE — Telephone Encounter (Signed)
Pt is sch next Monday and has not been reached to schedule her ultrasound per pt.

## 2021-09-10 NOTE — Telephone Encounter (Signed)
Pt scheduled  

## 2021-09-12 ENCOUNTER — Ambulatory Visit (HOSPITAL_COMMUNITY)
Admission: RE | Admit: 2021-09-12 | Discharge: 2021-09-12 | Disposition: A | Discharge status: Home / Self Care | Payer: Medicaid Other | Source: Ambulatory Visit | Attending: Nurse Practitioner | Admitting: Nurse Practitioner

## 2021-09-12 DIAGNOSIS — E0590 Thyrotoxicosis, unspecified without thyrotoxic crisis or storm: Secondary | ICD-10-CM | POA: Diagnosis not present

## 2021-09-12 DIAGNOSIS — E0500 Thyrotoxicosis with diffuse goiter without thyrotoxic crisis or storm: Secondary | ICD-10-CM | POA: Insufficient documentation

## 2021-09-16 ENCOUNTER — Ambulatory Visit: Payer: Medicaid Other | Admitting: Nurse Practitioner

## 2021-09-17 LAB — T3, FREE: T3, Free: 7.4 pg/mL — ABNORMAL HIGH (ref 2.0–4.4)

## 2021-09-17 LAB — THYROGLOBULIN ANTIBODY: Thyroglobulin Antibody: 1.2 IU/mL — ABNORMAL HIGH (ref 0.0–0.9)

## 2021-09-17 LAB — THYROID PEROXIDASE ANTIBODY: Thyroperoxidase Ab SerPl-aCnc: <9 IU/mL (ref 0–34)

## 2021-09-17 LAB — TSH: TSH: <0.005 u[IU]/mL — ABNORMAL LOW (ref 0.450–4.500)

## 2021-09-17 LAB — T4, FREE: Free T4: 2.38 ng/dL — ABNORMAL HIGH (ref 0.82–1.77)

## 2021-09-24 ENCOUNTER — Ambulatory Visit: Payer: Medicaid Other | Admitting: Student

## 2021-10-29 ENCOUNTER — Telehealth: Payer: Self-pay | Admitting: Nurse Practitioner

## 2021-10-29 NOTE — Telephone Encounter (Signed)
Wants a new referral entered for a new endo where she now lives. The endo referral needs to be to Dr Elveria Rising at [address] [place] [address]

## 2021-10-30 ENCOUNTER — Other Ambulatory Visit: Payer: Self-pay | Admitting: Nurse Practitioner

## 2021-10-30 DIAGNOSIS — E0590 Thyrotoxicosis, unspecified without thyrotoxic crisis or storm: Secondary | ICD-10-CM

## 2021-10-30 NOTE — Telephone Encounter (Signed)
States currently yes but moved 4 hrs away so will be finding a new pcp. Her old endo wouldn't send a referral to a new one and said it had to come from her pcp

## 2022-02-19 ENCOUNTER — Encounter: Payer: Self-pay | Admitting: Nurse Practitioner

## 2022-02-19 DIAGNOSIS — E0590 Thyrotoxicosis, unspecified without thyrotoxic crisis or storm: Secondary | ICD-10-CM

## 2022-02-19 DIAGNOSIS — E049 Nontoxic goiter, unspecified: Secondary | ICD-10-CM

## 2022-02-19 DIAGNOSIS — E0500 Thyrotoxicosis with diffuse goiter without thyrotoxic crisis or storm: Secondary | ICD-10-CM

## 2022-04-08 ENCOUNTER — Encounter: Payer: Self-pay | Admitting: Nurse Practitioner

## 2022-12-26 IMAGING — DX DG CHEST 2V
2 series · 2 of 2 positions shown · non-contrast
Comparison: None.

CLINICAL DATA: Rapid heart rate.

EXAM:
CHEST - 2 VIEW

[chest pa]
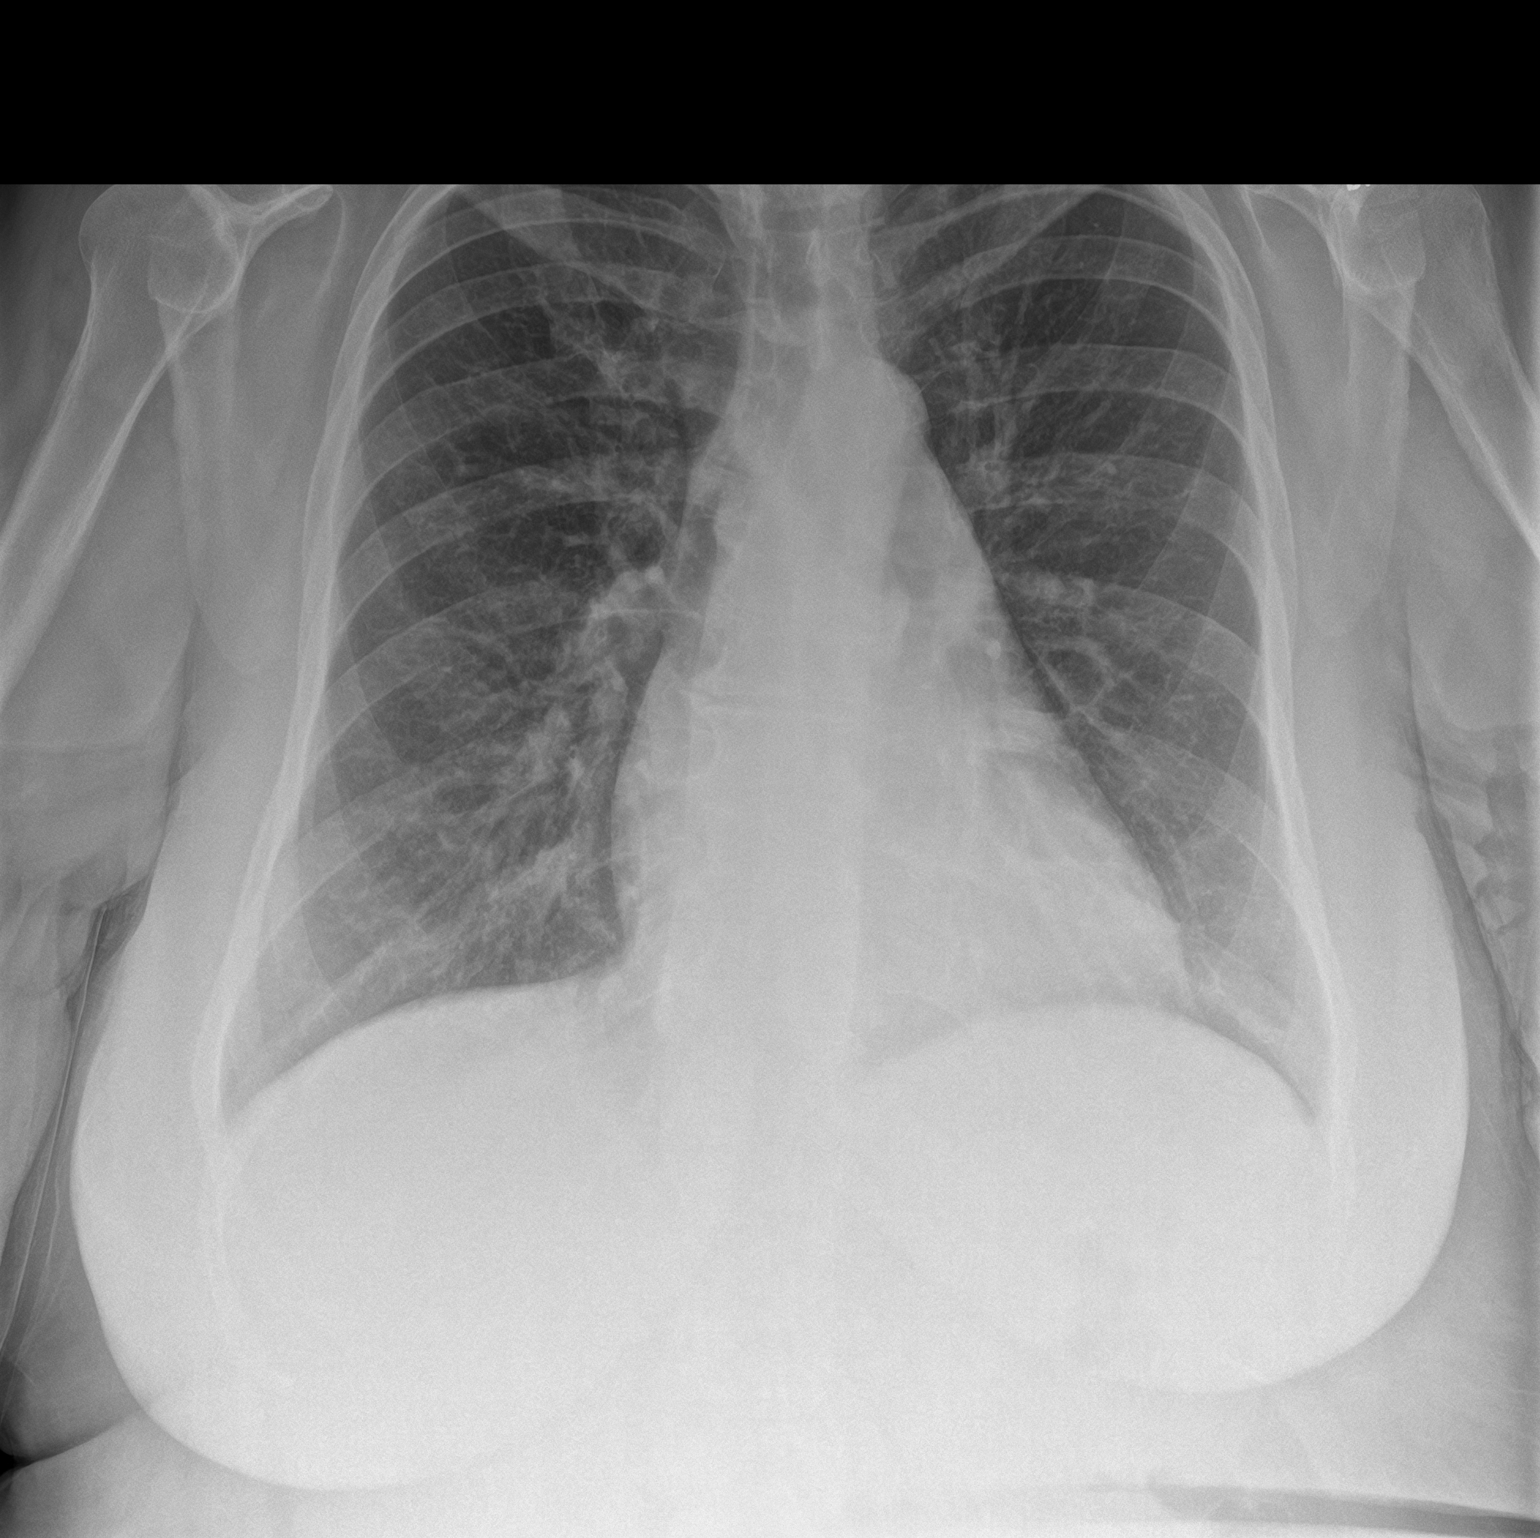

[chest lat]
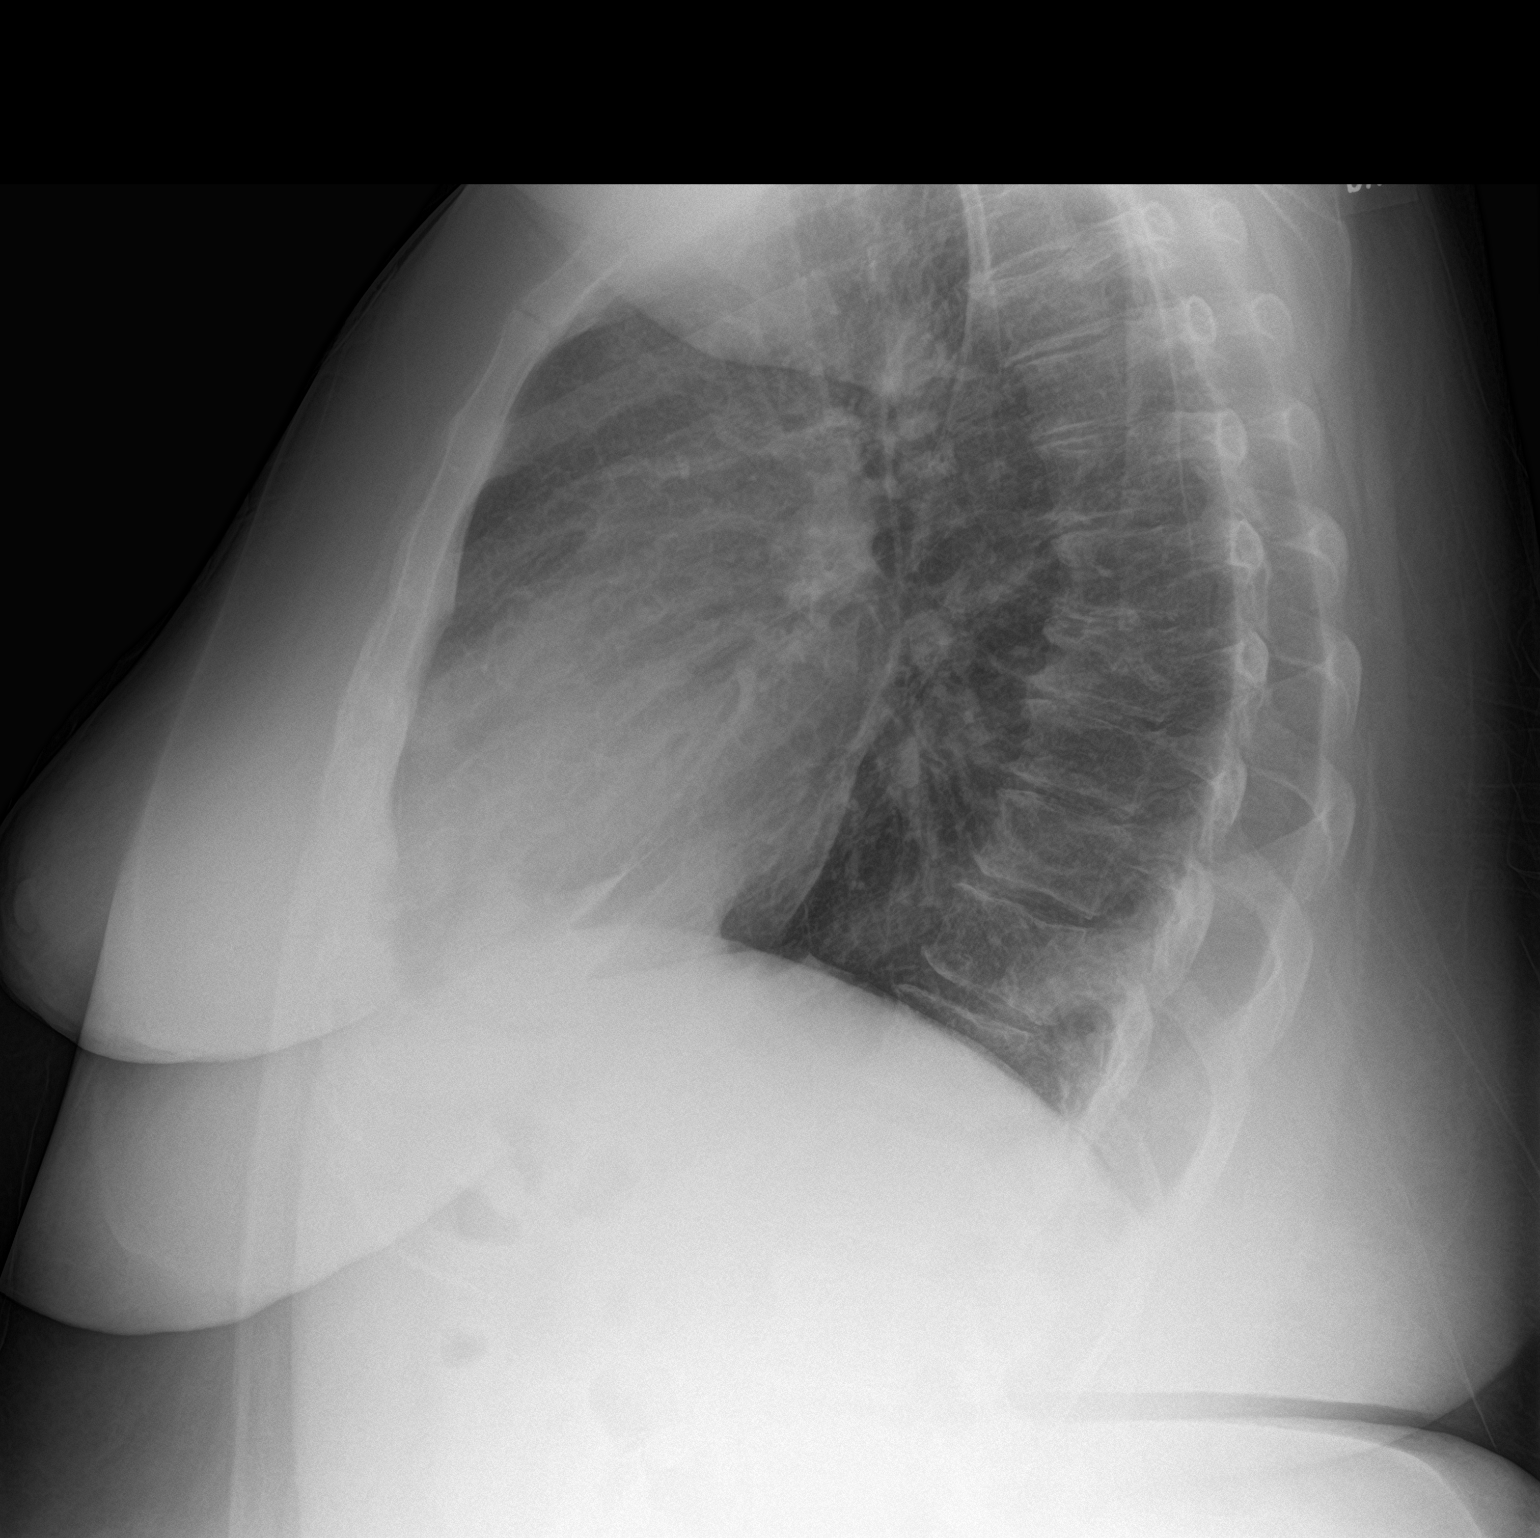

[2 of 2 positions shown; findings below may reference images not displayed]

FINDINGS: Mild enlargement of the cardiac silhouette. No consolidation or
edema. No visible pleural effusions or pneumothorax. Multilevel
degenerative change of the thoracic spine. Mild height loss of a
midthoracic vertebral body.
IMPRESSION: 1. No evidence of acute cardiopulmonary disease.
2. Mild cardiomegaly.
3. Mild height loss of a midthoracic vertebral body, which may be
remote or degenerative but is age indeterminate in the absence of
priors. Recommend correlation with the presence or absence of point
tenderness or a history of recent trauma.

## 2023-03-05 IMAGING — DX DG CHEST 1V PORT
1 series · 1 of 1 positions shown · non-contrast
Comparison: Chest radiograph dated 08/15/2020.

CLINICAL DATA: 44-year-old female with shortness of breath and
palpitation.

EXAM:
PORTABLE CHEST 1 VIEW

[chest ap]
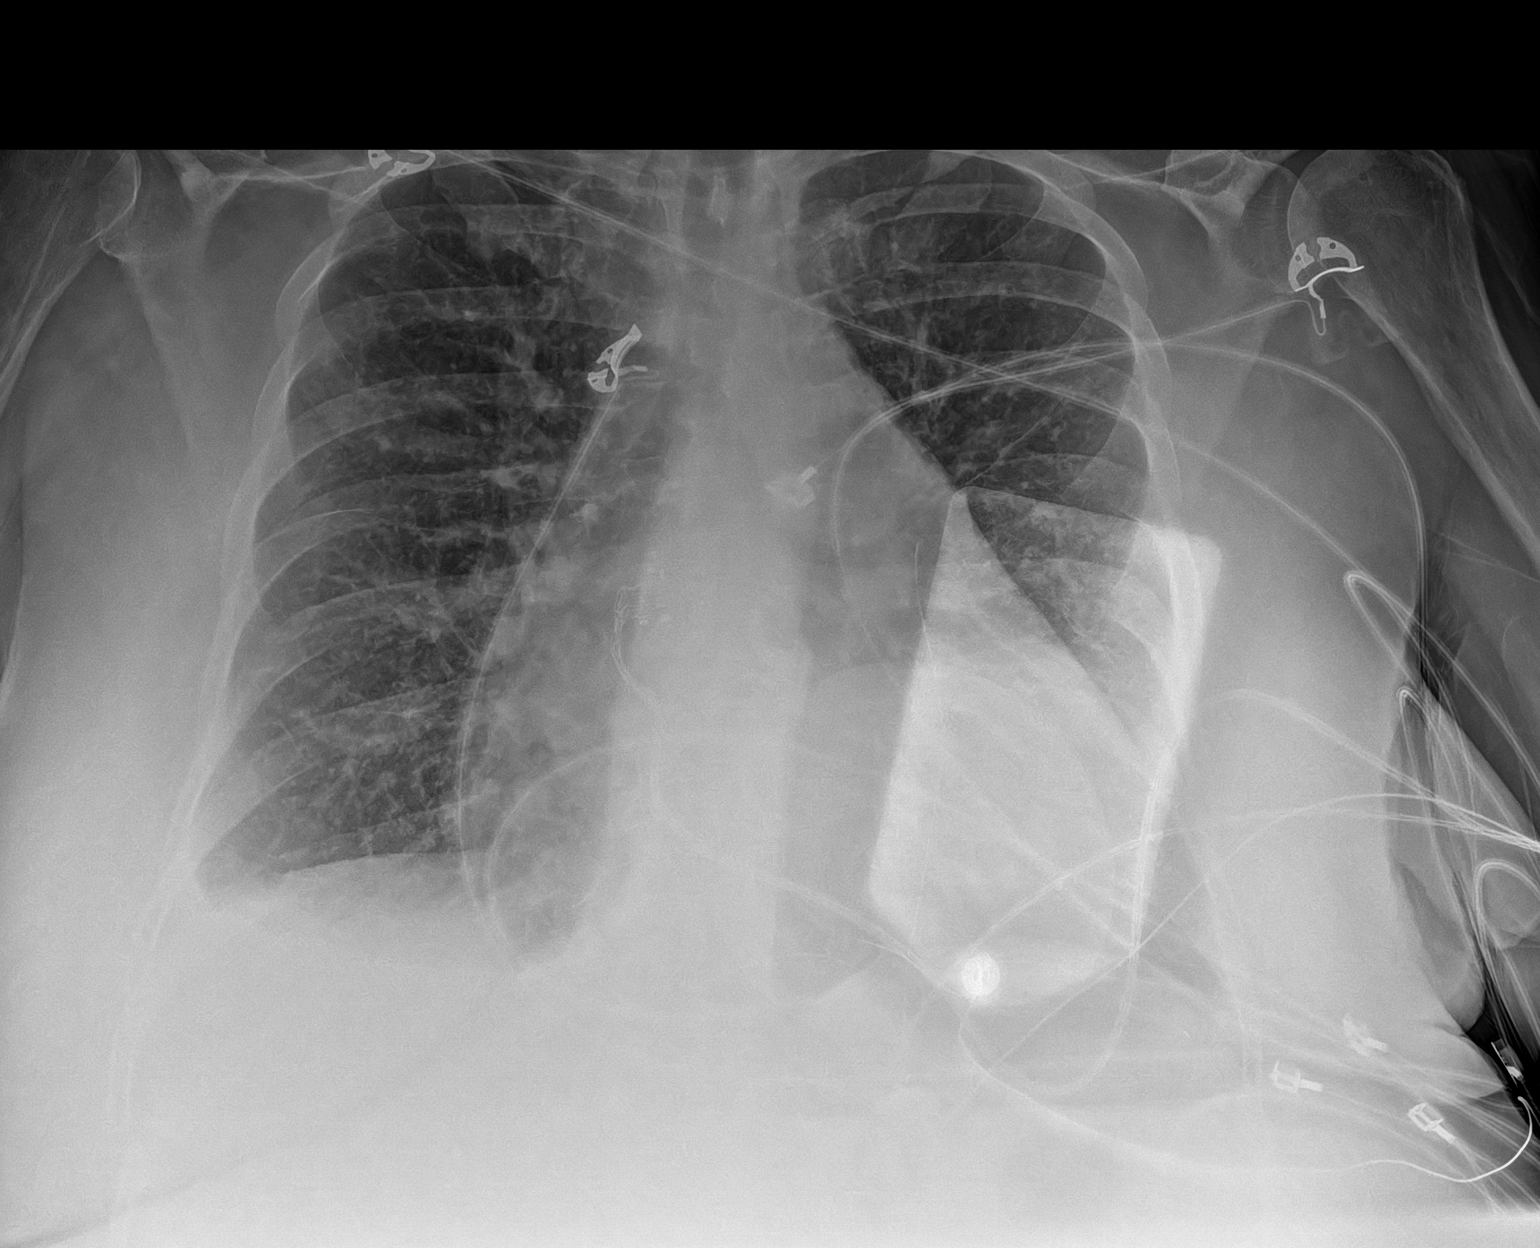

[1 of 1 positions shown; findings below may reference images not displayed]

FINDINGS: Evaluation is limited due to overlying support wires and pads. There
is cardiomegaly with mild vascular congestion. Probable trace
bilateral pleural effusions. No focal consolidation or pneumothorax.
No acute osseous pathology.
IMPRESSION: Cardiomegaly with mild vascular congestion. No focal consolidation.

## 2023-03-06 IMAGING — DX DG CHEST 1V PORT
1 series · 1 of 1 positions shown · non-contrast
Comparison: 10/23/2020

CLINICAL DATA: Pulmonary vascular congestion

EXAM:
PORTABLE CHEST 1 VIEW

[chest ap grid]
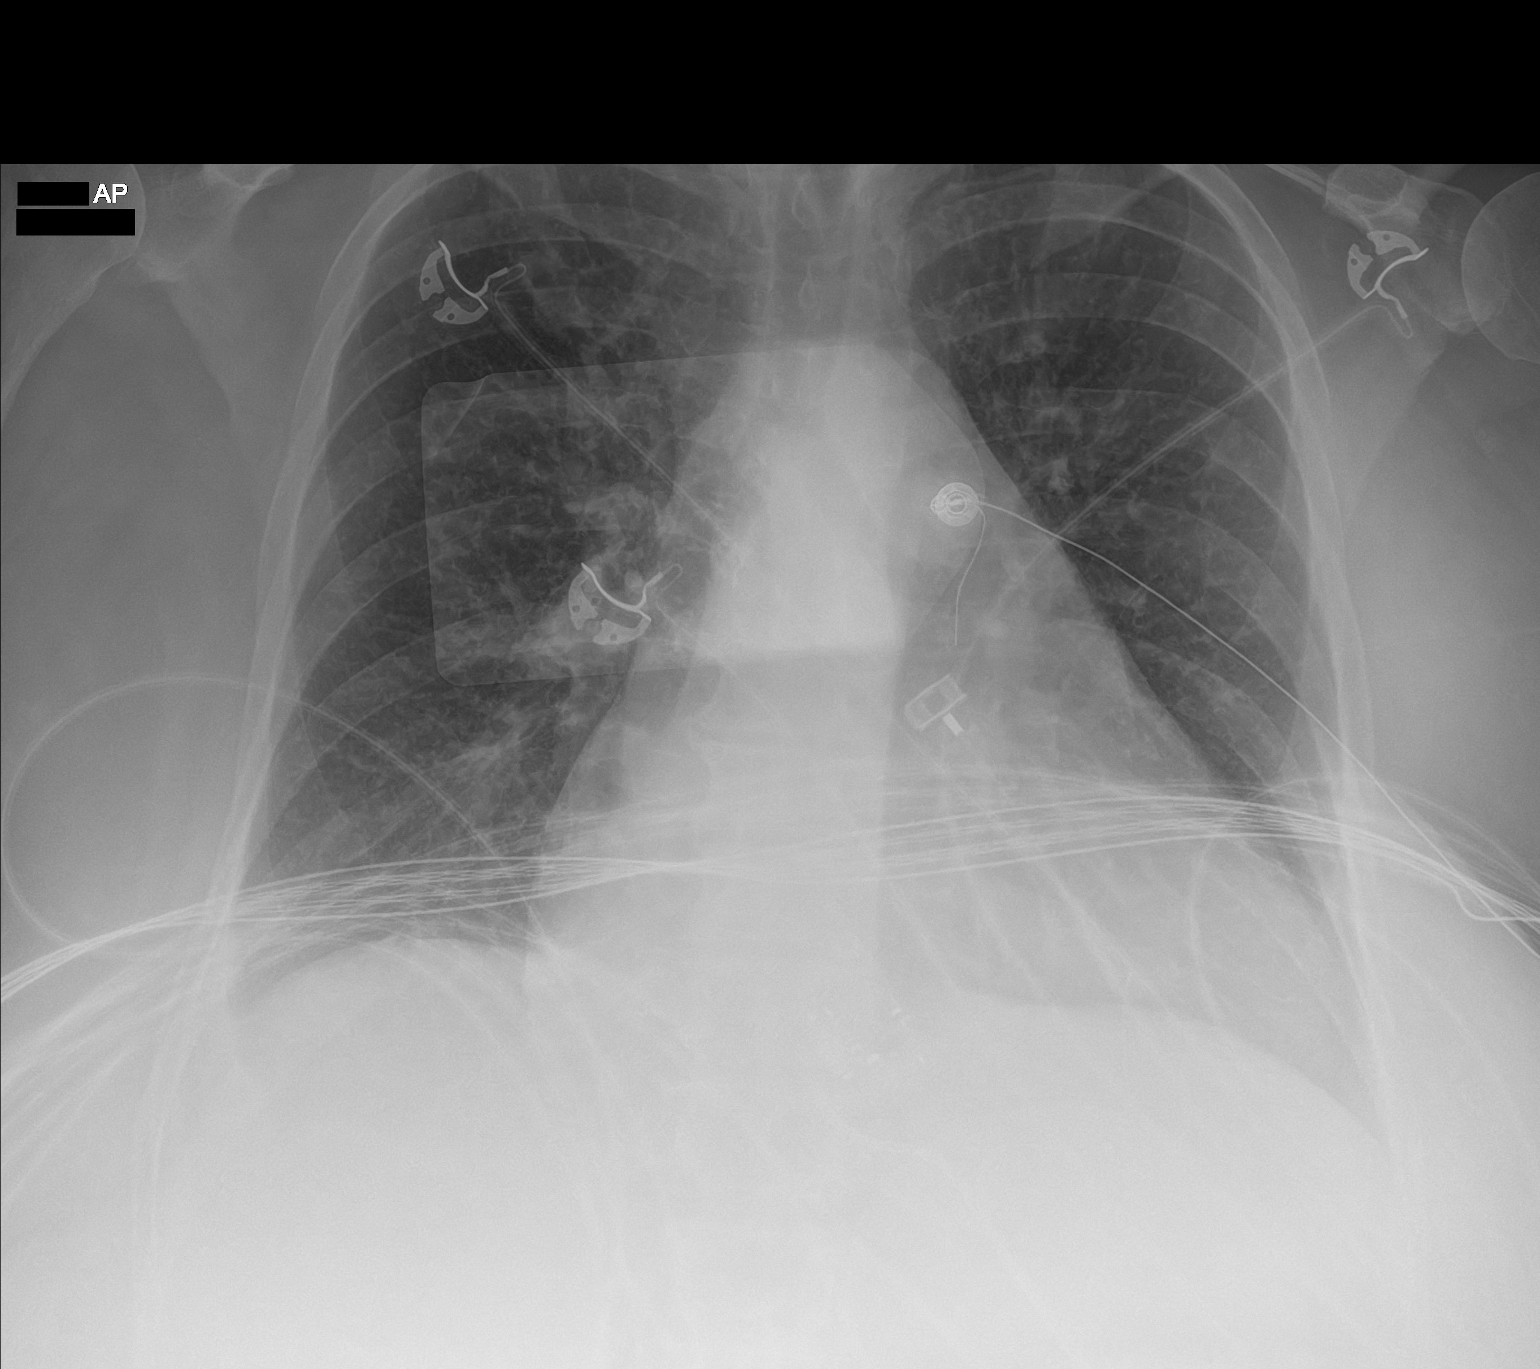

[1 of 1 positions shown; findings below may reference images not displayed]

FINDINGS: Mild bilateral interstitial thickening. No focal consolidation. No
pleural effusion or pneumothorax. Stable cardiomegaly.

No acute osseous abnormality.
IMPRESSION: Cardiomegaly with mild pulmonary vascular congestion.

## 2024-01-23 IMAGING — US US THYROID
1 series · 14 of 25 positions shown · non-contrast
Comparison: None Available.

CLINICAL DATA: Hyperthyroid.

EXAM:
THYROID ULTRASOUND
TECHNIQUE: Ultrasound examination of the thyroid gland and adjacent soft
tissues was performed.

[Series 1: us thyroid · 14 of 136 slices shown]
[im 1/136]
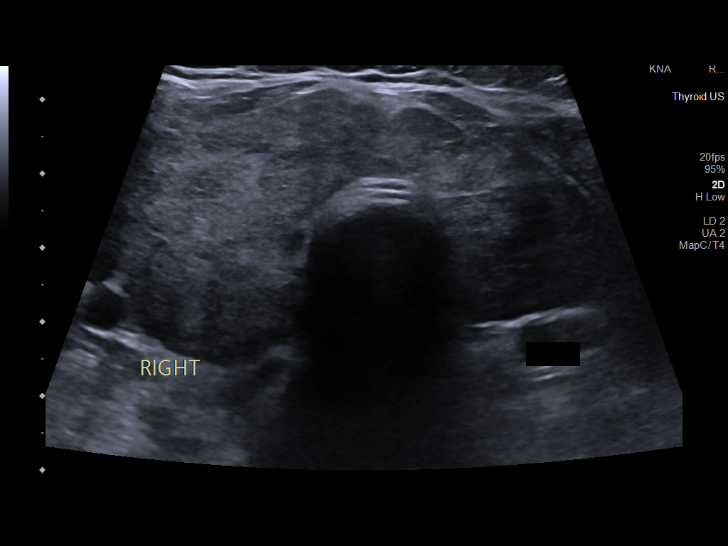
[im 12/136]
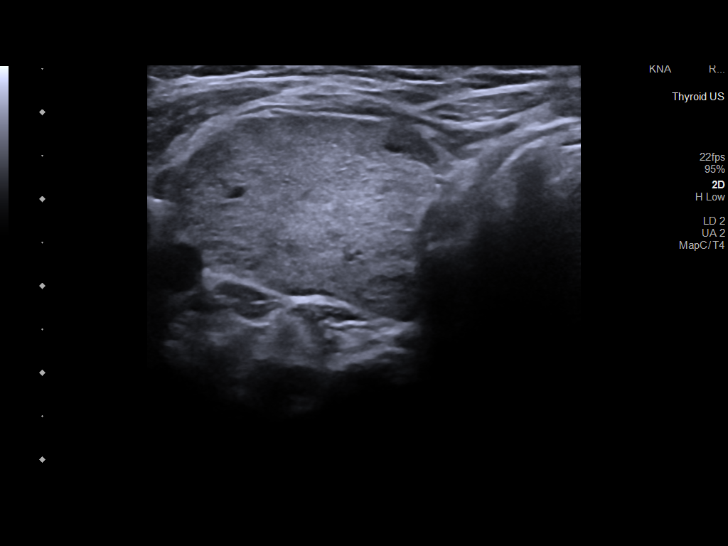
[im 23/136]
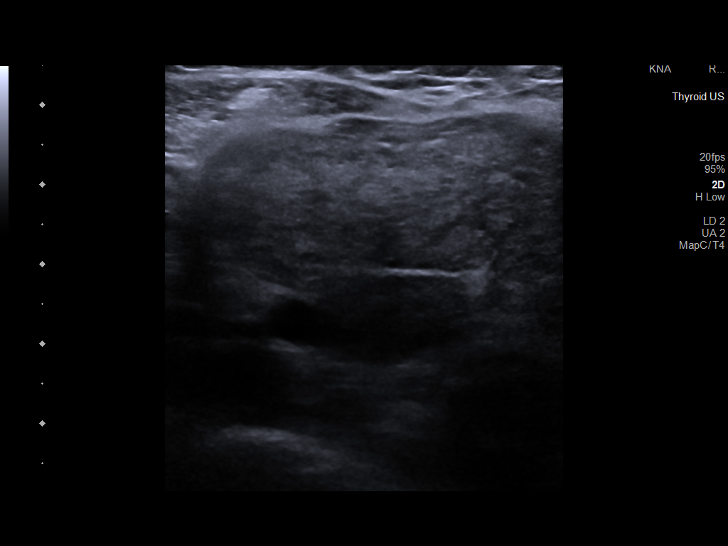
[im 34/136]
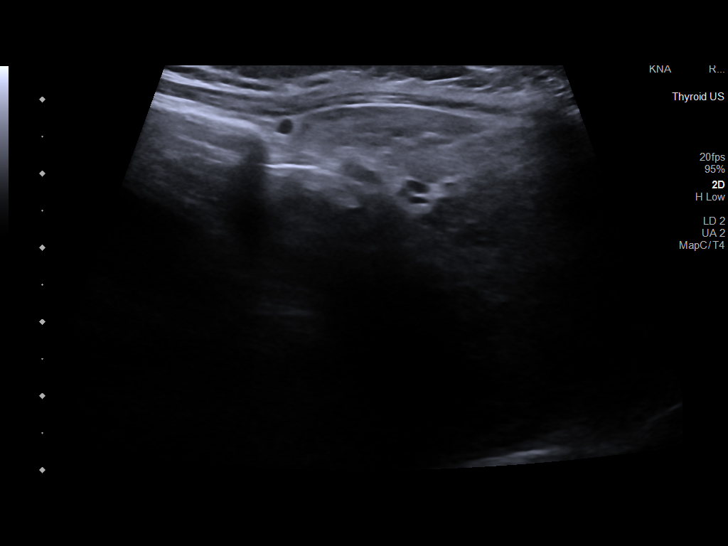
[im 46/136]
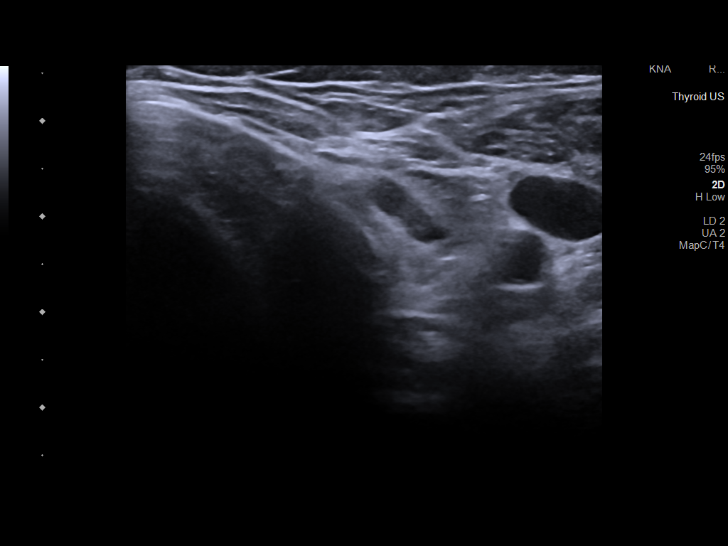
[im 51/136]
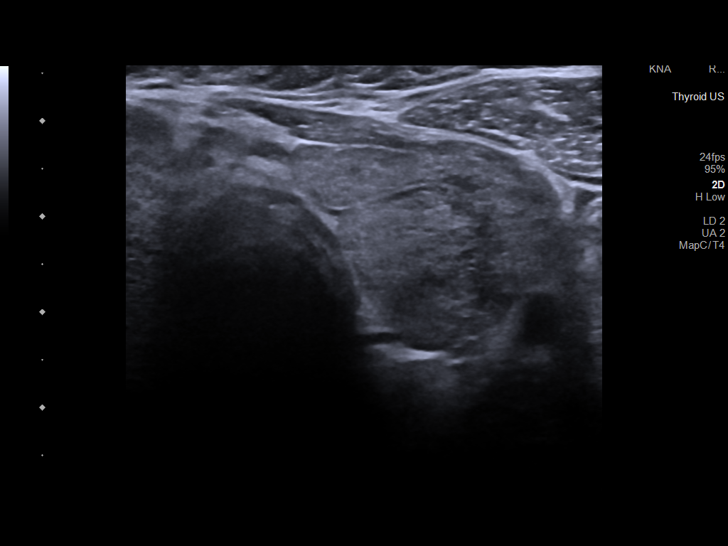
[im 62/136]
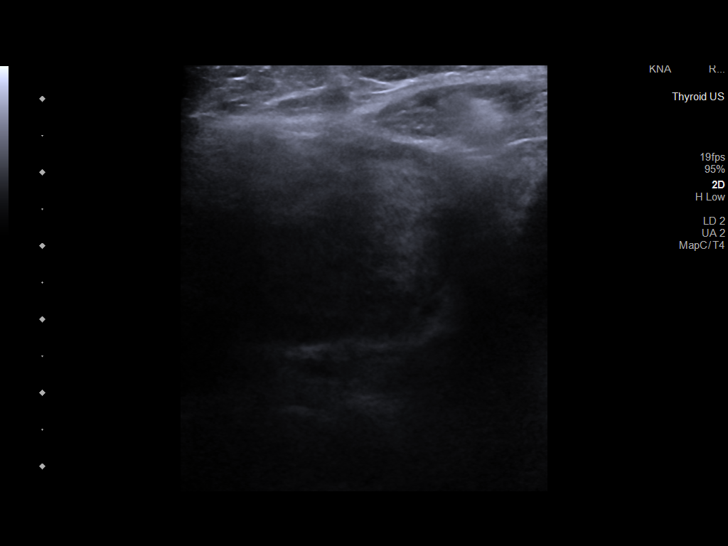
[im 74/136]
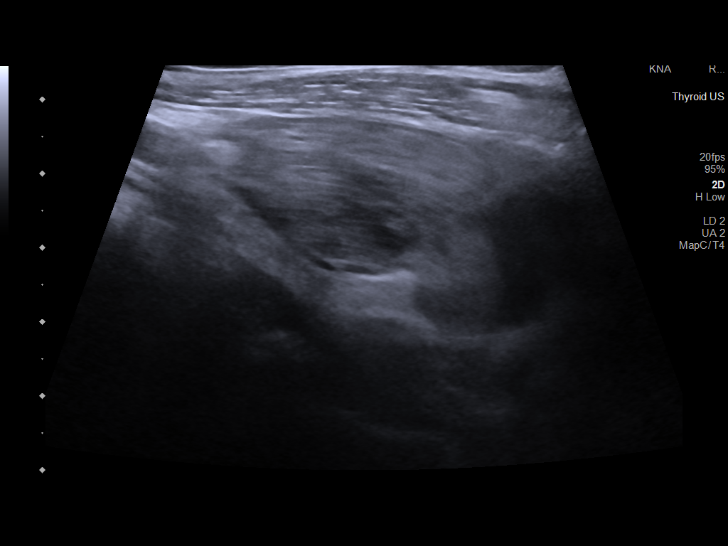
[im 85/136]
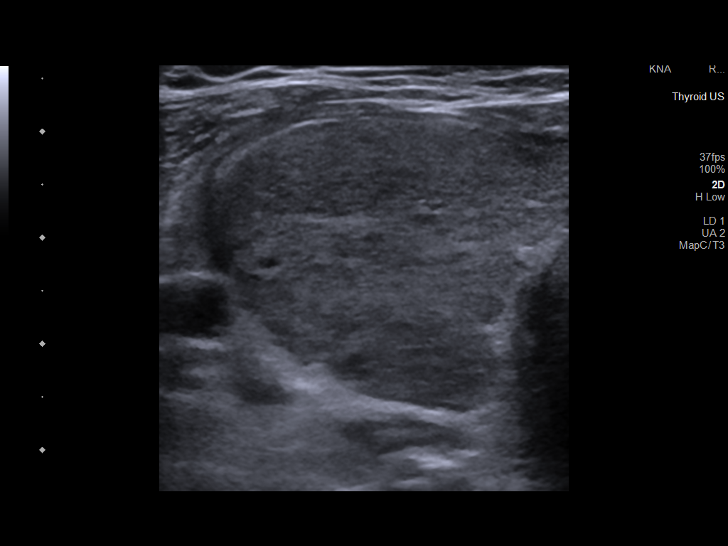
[im 91/136]
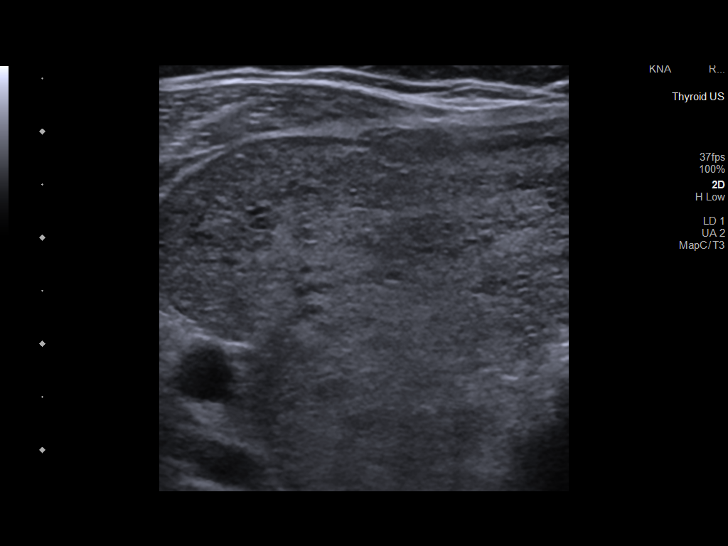
[im 102/136]
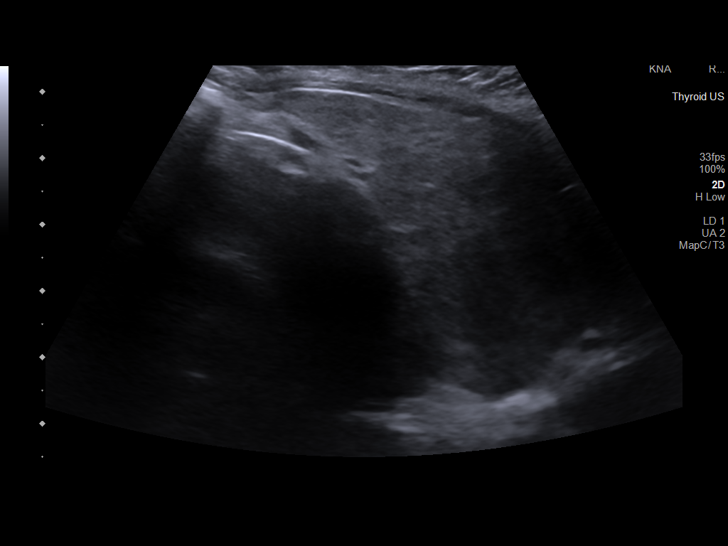
[im 113/136]
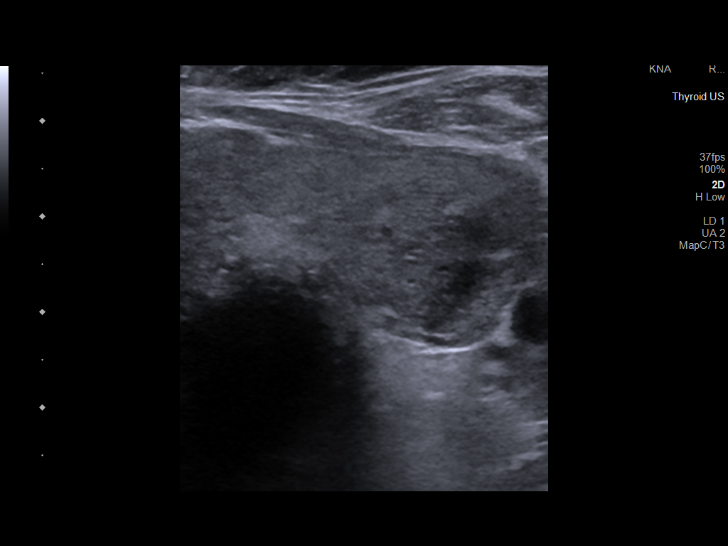
[im 124/136]
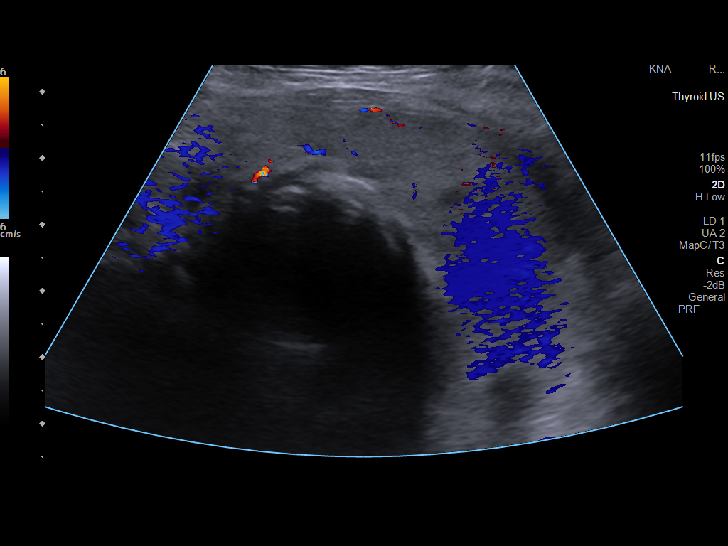
[im 136/136]
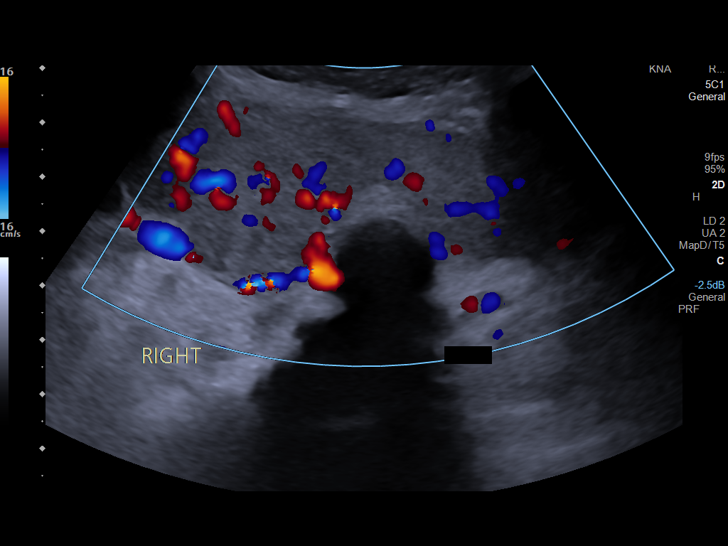

[14 of 25 positions shown; findings below may reference images not displayed]

FINDINGS: Parenchymal Echotexture: Moderately heterogenous

Isthmus: 1.5 cm

Right lobe: 7.6 x 2.7 x 3.2 cm

Left lobe: 6.7 x 1.8 x 2.5 cm

_________________________________________________________

Estimated total number of nodules >/= 1 cm: 0

Number of spongiform nodules >/=  2 cm not described below (TR1): 0

Number of mixed cystic and solid nodules >/= 1.5 cm not described
below (TR2): 0

_________________________________________________________

No discrete nodules are seen within the thyroid gland. No
significant hypervascularity. The thyroid gland is diffusely
enlarged and heterogeneous.
IMPRESSION: Diffusely enlarged and heterogeneous thyroid gland without
hypervascularity. Differential considerations include thyroiditis
versus diffuse goitrous change.

No thyroid nodules identified.
